# Patient Record
Sex: Female | Born: 1985
Health system: Southern US, Community
[De-identification: ages and names within clinical notes are randomized; demographics above are authoritative.]

## PROBLEM LIST (undated history)

## (undated) DIAGNOSIS — R12 Heartburn: Secondary | ICD-10-CM

## (undated) DIAGNOSIS — B019 Varicella without complication: Secondary | ICD-10-CM

## (undated) DIAGNOSIS — R17 Unspecified jaundice: Secondary | ICD-10-CM

## (undated) HISTORY — PX: WISDOM TOOTH EXTRACTION: SHX21

## (undated) HISTORY — DX: Heartburn: R12

## (undated) HISTORY — DX: Varicella without complication: B01.9

## (undated) HISTORY — DX: Unspecified jaundice: R17

---

## 2014-08-29 ENCOUNTER — Ambulatory Visit (INDEPENDENT_AMBULATORY_CARE_PROVIDER_SITE_OTHER): Payer: 59 | Admitting: Obstetrics & Gynecology

## 2014-08-29 ENCOUNTER — Encounter: Payer: Self-pay | Admitting: Obstetrics & Gynecology

## 2014-08-29 VITALS — BP 109/67 | HR 73 | Resp 16 | Ht 62.0 in | Wt 145.0 lb

## 2014-08-29 DIAGNOSIS — Z113 Encounter for screening for infections with a predominantly sexual mode of transmission: Secondary | ICD-10-CM

## 2014-08-29 DIAGNOSIS — Z01419 Encounter for gynecological examination (general) (routine) without abnormal findings: Secondary | ICD-10-CM | POA: Diagnosis not present

## 2014-08-29 DIAGNOSIS — Z124 Encounter for screening for malignant neoplasm of cervix: Secondary | ICD-10-CM

## 2014-08-29 DIAGNOSIS — Z1151 Encounter for screening for human papillomavirus (HPV): Secondary | ICD-10-CM | POA: Diagnosis not present

## 2014-08-29 DIAGNOSIS — Z0000 Encounter for general adult medical examination without abnormal findings: Secondary | ICD-10-CM

## 2014-08-29 NOTE — Progress Notes (Signed)
Subjective:    Brandi Estrada is a 29 y.o. MW G23[employer] who presents for an annual exam. The patient has no complaints today. The patient is sexually active. GYN screening history: last pap: was normal. The patient wears seatbelts: yes. The patient participates in regular exercise: yes. Has the patient ever been transfused or tattooed?: yes. The patient reports that there is not domestic violence in her life.   Menstrual History: OB History    Gravida Para Term Preterm AB TAB SAB Ectopic Multiple Living   '0 0 0 0 0 0 0 0 0 0 '      Menarche age: [age] No LMP recorded. Patient is not currently having periods (Reason: IUD).    The following portions of the patient's history were reviewed and updated as appropriate: allergies, current medications, past family history, past medical history, past social history, past surgical history and problem list.  Review of Systems Pertinent items are noted in HPI. 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 In a relative   Objective:    BP 109/67 mmHg  Pulse 73  Resp 16  Ht '5\' 2"'  (1.575 m)  Wt 145 lb (65.772 kg)  BMI 26.51 kg/m2  General Appearance:    Alert, cooperative, no distress, appears stated age  Head:    Normocephalic, without obvious abnormality, atraumatic  Eyes:    PERRL, conjunctiva/corneas clear, EOM's intact, fundi    benign, both eyes  Ears:    Normal TM's and external ear canals, both ears  Nose:   Nares normal, septum midline, mucosa normal, no drainage    or sinus tenderness  Throat:   Lips, mucosa, and tongue normal; teeth and gums normal  Neck:   Supple, symmetrical, trachea midline, no adenopathy;    thyroid:  no enlargement/tenderness/nodules; no carotid   bruit or JVD  Back:     Symmetric, no curvature, ROM normal, no CVA tenderness  Lungs:      Clear to auscultation bilaterally, respirations unlabored  Chest Wall:    No tenderness or deformity   Heart:    Regular rate and rhythm, S1 and S2 normal, no murmur, rub   or gallop  Breast Exam:    No tenderness, masses, or nipple abnormality  Abdomen:     Soft, non-tender, bowel sounds active all four quadrants,    no masses, no organomegaly  Genitalia:    Normal female without lesion, discharge or tenderness, shaved, NSSA, NT, mobile, normal adnexal exam     Extremities:   Extremities normal, atraumatic, no cyanosis or edema  Pulses:   2+ and symmetric all extremities  Skin:   Skin color, texture, turgor normal, no rashes or lesions  Lymph nodes:   Cervical, supraclavicular, and axillary nodes normal  Neurologic:   CNII-XII intact, normal strength, sensation and reflexes    throughout  .    Assessment:    Healthy female exam.   FH of breast/colon/ovary cancer   Plan:     Breast self exam technique reviewed and patient encouraged to perform self-exam monthly. Thin prep Pap smear.   Rec BRCA testing

## 2014-08-30 ENCOUNTER — Other Ambulatory Visit: Payer: 59

## 2014-08-30 LAB — CBC
HCT: 43.6 % (ref 36.0–46.0)
Hemoglobin: 14.8 g/dL (ref 12.0–15.0)
MCH: 29.3 pg (ref 26.0–34.0)
MCHC: 33.9 g/dL (ref 30.0–36.0)
MCV: 86.3 fL (ref 78.0–100.0)
MPV: 8.7 fL (ref 8.6–12.4)
Platelets: 251 10*3/uL (ref 150–400)
RBC: 5.05 MIL/uL (ref 3.87–5.11)
RDW: 13.2 % (ref 11.5–15.5)
WBC: 5.6 10*3/uL (ref 4.0–10.5)

## 2014-08-31 LAB — COMPREHENSIVE METABOLIC PANEL
ALBUMIN: 4.3 g/dL (ref 3.5–5.2)
ALK PHOS: 46 U/L (ref 39–117)
ALT: 11 U/L (ref 0–35)
AST: 14 U/L (ref 0–37)
BILIRUBIN TOTAL: 0.6 mg/dL (ref 0.2–1.2)
BUN: 14 mg/dL (ref 6–23)
CO2: 25 meq/L (ref 19–32)
Calcium: 9 mg/dL (ref 8.4–10.5)
Chloride: 104 mEq/L (ref 96–112)
Creat: 0.61 mg/dL (ref 0.50–1.10)
Glucose, Bld: 84 mg/dL (ref 70–99)
POTASSIUM: 4.2 meq/L (ref 3.5–5.3)
Sodium: 139 mEq/L (ref 135–145)
Total Protein: 7.1 g/dL (ref 6.0–8.3)

## 2014-08-31 LAB — VITAMIN D 25 HYDROXY (VIT D DEFICIENCY, FRACTURES): Vit D, 25-Hydroxy: 30 ng/mL (ref 30–100)

## 2014-08-31 LAB — LIPID PANEL
CHOL/HDL RATIO: 2.9 ratio
Cholesterol: 142 mg/dL (ref 0–200)
HDL: 49 mg/dL (ref >=46)
LDL Cholesterol: 83 mg/dL (ref 0–99)
Triglycerides: 50 mg/dL (ref <150)
VLDL: 10 mg/dL (ref 0–40)

## 2014-08-31 LAB — TSH: TSH: 1.95 u[IU]/mL (ref 0.350–4.500)

## 2014-09-02 ENCOUNTER — Telehealth: Payer: Self-pay | Admitting: *Deleted

## 2014-09-02 LAB — CYTOLOGY - PAP

## 2014-09-02 NOTE — Telephone Encounter (Signed)
LM on voicemail of normal labwork and will mail a copy to pt's home address.

## 2014-09-10 ENCOUNTER — Telehealth: Payer: Self-pay | Admitting: *Deleted

## 2014-09-10 DIAGNOSIS — R7989 Other specified abnormal findings of blood chemistry: Secondary | ICD-10-CM

## 2014-09-10 MED ORDER — VITAMIN D (ERGOCALCIFEROL) 1.25 MG (50000 UNIT) PO CAPS
50000.0000 [IU] | ORAL_CAPSULE | ORAL | Status: DC
Start: 2014-09-10 — End: 2016-06-30

## 2014-09-10 NOTE — Telephone Encounter (Signed)
-----   Message from Allie BossierMyra C Dove, MD sent at 09/09/2014  1:39 PM EDT ----- She will need to take prescription Vitamin D for 8 weeks. Thanks.

## 2014-09-10 NOTE — Telephone Encounter (Signed)
LMOM for pt to adv Vitamin D sent to pharm per Dr Marice Potterove.

## 2014-10-14 ENCOUNTER — Other Ambulatory Visit: Payer: 59

## 2014-11-21 ENCOUNTER — Other Ambulatory Visit: Payer: 59

## 2014-12-05 ENCOUNTER — Encounter: Payer: Self-pay | Admitting: *Deleted

## 2014-12-05 ENCOUNTER — Telehealth: Payer: Self-pay | Admitting: *Deleted

## 2014-12-05 NOTE — Telephone Encounter (Signed)
Pt notified of neg BRAC 1 and 2 and her copy given to her

## 2015-01-01 ENCOUNTER — Emergency Department
Admission: EM | Admit: 2015-01-01 | Discharge: 2015-01-01 | Disposition: A | Discharge status: Home / Self Care | Payer: 59 | Source: Home / Self Care | Attending: Family Medicine | Admitting: Family Medicine

## 2015-01-01 DIAGNOSIS — J069 Acute upper respiratory infection, unspecified: Secondary | ICD-10-CM

## 2015-01-01 LAB — POCT RAPID STREP A (OFFICE): Rapid Strep A Screen: NEGATIVE

## 2015-01-01 NOTE — ED Provider Notes (Signed)
CSN: [id number]     Arrival date & time 01/01/15  1105 History   First MD Initiated Contact with Patient 01/01/15 1118     Chief Complaint  Patient presents with  . Otalgia  . Sore Throat   (Consider location/radiation/quality/duration/timing/severity/associated sxs/prior Treatment) HPI  Patient is a 29 year old female presenting to urgent care with complaint of gradually worsening sore throat, right ear pain, body aches, subjective fever with chills.  She is also complaining of diffuse headache and sinus pressure with yellow discharge.  Pain is 6/10 at worst.  She has been taking ibuprofen with minimal relief.  Denies any nausea or vomiting.  Patient does work as a Lawyer at [employer]. No recent travel. No rashes or known tick bites.  History reviewed. No pertinent past medical history. Past Surgical History  Procedure Laterality Date  . Wisdom tooth extraction     Family History  Problem Relation Age of Onset  . Diabetes Father   . Diabetes Paternal Grandfather   . Cancer Maternal Grandmother     colon  . Heart disease Maternal Grandfather     heart surgery bypass  . Heart disease Maternal Uncle     heart trannsplant  . Cancer Mother     breast - Premenopause  . Cancer - Cervical Maternal Aunt   . Cancer Maternal Aunt     bone  . Hemachromatosis Father   . Hyperlipidemia Father   . Stroke Paternal Grandmother     lived to 96   History  Substance Use Topics  . Smoking status: Never Smoker   . Smokeless tobacco: Never Used  . Alcohol Use: No   OB History    Gravida Para Term Preterm AB TAB SAB Ectopic Multiple Living       Review of Systems  Constitutional: Positive for fever ( subjective), chills and fatigue. Negative for appetite change.  HENT: Positive for congestion, ear pain (Right), rhinorrhea, sinus pressure and sore throat. Negative for trouble swallowing and voice change.   Respiratory: Negative for cough and shortness of breath.    Cardiovascular: Negative for chest pain and palpitations.  Gastrointestinal: Negative for nausea, vomiting, abdominal pain and diarrhea.  Musculoskeletal: Negative for myalgias, back pain and arthralgias.  Skin: Negative for rash.  Neurological: Positive for headaches. Negative for dizziness and light-headedness.  All other systems reviewed and are negative.   Allergies  Review of patient's allergies indicates no known allergies.  Home Medications   Prior to Admission medications   Medication Sig Start Date End Date Taking? Authorizing Provider  cetirizine (ZYRTEC) 10 MG tablet Take 10 mg by mouth daily.   Yes Historical Provider, MD  Levonorgestrel (SKYLA IU) by Intrauterine route.    Historical Provider, MD  Multiple Vitamin (MULTIVITAMIN) tablet Take 1 tablet by mouth daily.    Historical Provider, MD  Vitamin D, Ergocalciferol, (DRISDOL) 50000 UNITS CAPS capsule Take 1 capsule (50,000 Units total) by mouth every 7 (seven) days. Take 1 capsule by mouth once a week for 8 weeks 09/10/14   Kendrick Ranch, MD   BP 100/67 mmHg  Pulse 95  Temp(Src) 99.5 F (37.5 C) (Oral)  Ht  (1.549 m)  Wt 135 lb 8 oz (61.462 kg)  BMI 25.62 kg/m2  SpO2 99%  LMP 12/18/2014 Physical Exam  Constitutional: She appears well-developed and well-nourished. No distress.  HENT:  Head: Normocephalic and atraumatic.  Right Ear: Hearing, external ear and ear canal normal.  No swelling or tenderness. No mastoid tenderness. Tympanic membrane is erythematous. Tympanic membrane is not retracted and not bulging. No middle ear effusion.  Left Ear: Hearing, tympanic membrane, external ear and ear canal normal.  Nose: Mucosal edema present. Right sinus exhibits no maxillary sinus tenderness and no frontal sinus tenderness. Left sinus exhibits no maxillary sinus tenderness and no frontal sinus tenderness.  Mouth/Throat: Uvula is midline and mucous membranes are normal. Posterior oropharyngeal erythema present.  No oropharyngeal exudate, posterior oropharyngeal edema or tonsillar abscesses.  Eyes: Conjunctivae are normal. No scleral icterus.  Neck: Normal range of motion. Neck supple.  Cardiovascular: Normal rate, regular rhythm and normal heart sounds.   Pulmonary/Chest: Effort normal and breath sounds normal. No respiratory distress. She has no wheezes. She has no rales. She exhibits no tenderness.  Abdominal: Soft. Bowel sounds are normal. She exhibits no distension and no mass. There is no tenderness. There is no rebound and no guarding.  Musculoskeletal: Normal range of motion.  Lymphadenopathy:    She has no cervical adenopathy.  Neurological: She is alert.  Skin: Skin is warm and dry. She is not diaphoretic.  Nursing note and vitals reviewed.   ED Course  Procedures (including critical care time) Labs Review Labs Reviewed  POCT RAPID STREP A (OFFICE)    Imaging Review No results found.   MDM   1. Acute upper respiratory infection    Patient is a 29 year old female complaining of URI symptoms for last 2 days. Vitals unremarkable. Rapid strep negative.  Culture sent. No indication to start antibiotics at this time his symptoms likely viral in nature. Encourage fluids rest.  May take acetaminophen and ibuprofen as needed for fever and pain.  Follow-up with PCP in 3-4 days and recheck for symptoms if not improving. Return precautions provided. Pt verbalized understanding and agreement with tx plan.    Junius Finner, PA-C 01/01/15 1142

## 2015-01-01 NOTE — Discharge Instructions (Signed)
You may take 400-600mg Ibuprofen (Motrin) every 6-8 hours for fever and pain  °Alternate with Tylenol  °You may take 500mg Tylenol every 4-6 hours as needed for fever and pain  °Follow-up with your primary care provider next week for recheck of symptoms if not improving.  °Be sure to drink plenty of fluids and rest, at least 8hrs of sleep a night, preferably more while you are sick. °Return urgent care or go to closest ER if you cannot keep down fluids/signs of dehydration, fever not reducing with Tylenol, difficulty breathing/wheezing, stiff neck, worsening condition, or other concerns (see below)  ° °Cool Mist Vaporizers °Vaporizers may help relieve the symptoms of a cough and cold. They add moisture to the air, which helps mucus to become thinner and less sticky. This makes it easier to breathe and cough up secretions. Cool mist vaporizers do not cause serious burns like hot mist vaporizers, which may also be called steamers or humidifiers. Vaporizers have not been proven to help with colds. You should not use a vaporizer if you are allergic to mold. °HOME CARE INSTRUCTIONS °· Follow the package instructions for the vaporizer. °· Do not use anything other than distilled water in the vaporizer. °· Do not run the vaporizer all of the time. This can cause mold or bacteria to grow in the vaporizer. °· Clean the vaporizer after each time it is used. °· Clean and dry the vaporizer well before storing it. °· Stop using the vaporizer if worsening respiratory symptoms develop. °Document Released: 02/12/2004 Document Revised: 05/22/2013 Document Reviewed: 10/04/2012 °ExitCare® Patient Information ©2015 [employer]. This information is not intended to replace advice given to you by your health care provider. Make sure you discuss any questions you have with your health care provider. ° °Upper Respiratory Infection, Adult °An upper respiratory infection (URI) is also sometimes known as the common cold. The upper  respiratory tract includes the nose, sinuses, throat, trachea, and bronchi. Bronchi are the airways leading to the lungs. Most people improve within 1 week, but symptoms can last up to 2 weeks. A residual cough may last even longer.  °CAUSES °Many different viruses can infect the tissues lining the upper respiratory tract. The tissues become irritated and inflamed and often become very moist. Mucus production is also common. A cold is contagious. You can easily spread the virus to others by oral contact. This includes kissing, sharing a glass, coughing, or sneezing. Touching your mouth or nose and then touching a surface, which is then touched by another person, can also spread the virus. °SYMPTOMS  °Symptoms typically develop 1 to 3 days after you come in contact with a cold virus. Symptoms vary from person to person. They may include: °· Runny nose. °· Sneezing. °· Nasal congestion. °· Sinus irritation. °· Sore throat. °· Loss of voice (laryngitis). °· Cough. °· Fatigue. °· Muscle aches. °· Loss of appetite. °· Headache. °· Low-grade fever. °DIAGNOSIS  °You might diagnose your own cold based on familiar symptoms, since most people get a cold 2 to 3 times a year. Your caregiver can confirm this based on your exam. Most importantly, your caregiver can check that your symptoms are not due to another disease such as strep throat, sinusitis, pneumonia, asthma, or epiglottitis. Blood tests, throat tests, and X-rays are not necessary to diagnose a common cold, but they may sometimes be helpful in excluding other more serious diseases. Your caregiver will decide if any further tests are required. °RISKS AND COMPLICATIONS  °You may   be at risk for a more severe case of the common cold if you smoke cigarettes, have chronic heart disease (such as heart failure) or lung disease (such as asthma), or if you have a weakened immune system. The very young and very old are also at risk for more serious infections. Bacterial  sinusitis, middle ear infections, and bacterial pneumonia can complicate the common cold. The common cold can worsen asthma and chronic obstructive pulmonary disease (COPD). Sometimes, these complications can require emergency medical care and may be life-threatening. °PREVENTION  °The best way to protect against getting a cold is to practice good hygiene. Avoid oral or hand contact with people with cold symptoms. Wash your hands often if contact occurs. There is no clear evidence that vitamin C, vitamin E, echinacea, or exercise reduces the chance of developing a cold. However, it is always recommended to get plenty of rest and practice good nutrition. °TREATMENT  °Treatment is directed at relieving symptoms. There is no cure. Antibiotics are not effective, because the infection is caused by a virus, not by bacteria. Treatment may include: °· Increased fluid intake. Sports drinks offer valuable electrolytes, sugars, and fluids. °· Breathing heated mist or steam (vaporizer or shower). °· Eating chicken soup or other clear broths, and maintaining good nutrition. °· Getting plenty of rest. °· Using gargles or lozenges for comfort. °· Controlling fevers with ibuprofen or acetaminophen as directed by your caregiver. °· Increasing usage of your inhaler if you have asthma. °Zinc gel and zinc lozenges, taken in the first 24 hours of the common cold, can shorten the duration and lessen the severity of symptoms. Pain medicines may help with fever, muscle aches, and throat pain. A variety of non-prescription medicines are available to treat congestion and runny nose. Your caregiver can make recommendations and may suggest nasal or lung inhalers for other symptoms.  °HOME CARE INSTRUCTIONS  °· Only take over-the-counter or prescription medicines for pain, discomfort, or fever as directed by your caregiver. °· Use a warm mist humidifier or inhale steam from a shower to increase air moisture. This may keep secretions moist and  make it easier to breathe. °· Drink enough water and fluids to keep your urine clear or pale yellow. °· Rest as needed. °· Return to work when your temperature has returned to normal or as your caregiver advises. You may need to stay home longer to avoid infecting others. You can also use a face mask and careful hand washing to prevent spread of the virus. °SEEK MEDICAL CARE IF:  °· After the first few days, you feel you are getting worse rather than better. °· You need your caregiver's advice about medicines to control symptoms. °· You develop chills, worsening shortness of breath, or brown or red sputum. These may be signs of pneumonia. °· You develop yellow or brown nasal discharge or pain in the face, especially when you bend forward. These may be signs of sinusitis. °· You develop a fever, swollen neck glands, pain with swallowing, or white areas in the back of your throat. These may be signs of strep throat. °SEEK IMMEDIATE MEDICAL CARE IF:  °· You have a fever. °· You develop severe or persistent headache, ear pain, sinus pain, or chest pain. °· You develop wheezing, a prolonged cough, cough up blood, or have a change in your usual mucus (if you have chronic lung disease). °· You develop sore muscles or a stiff neck. °Document Released: 11/10/2000 Document Revised: 08/09/2011 Document Reviewed: 08/22/2013 °ExitCare® Patient   Information ©2015 [employer]. This information is not intended to replace advice given to you by your health care provider. Make sure you discuss any questions you have with your health care provider. ° °

## 2015-01-01 NOTE — ED Notes (Signed)
Symptoms started Monday with a sore throat mostly on the right,  Sore when swallowing, Monday and Tuesday nights, Achy,Chills and night,left ear pain, migraine, sinus pressure with yellow drainage

## 2015-01-02 LAB — STREP A DNA PROBE: GASP: NEGATIVE

## 2015-01-04 ENCOUNTER — Telehealth: Payer: Self-pay | Admitting: Emergency Medicine

## 2015-05-15 ENCOUNTER — Telehealth: Payer: Self-pay | Admitting: *Deleted

## 2015-05-15 NOTE — Telephone Encounter (Signed)
Pt called in wanting beta hcg due to having nausea and not wanting to eat. Pt took UPT - negative. Currently has IUD. Explained to pt that she would need to come in to see a provider in order for lab to be drawn. Pt works 3rd shift and no appt available when she was available. She explained she would wait a while longer to see if Sx subside. If not she will call back in to make appt.

## 2015-09-10 ENCOUNTER — Encounter: Payer: Self-pay | Admitting: Obstetrics & Gynecology

## 2015-09-10 ENCOUNTER — Ambulatory Visit: Payer: 59 | Admitting: Obstetrics & Gynecology

## 2015-09-10 VITALS — BP 133/82 | HR 78 | Resp 16 | Wt 142.0 lb

## 2015-09-10 DIAGNOSIS — Z30014 Encounter for initial prescription of intrauterine contraceptive device: Secondary | ICD-10-CM

## 2015-09-10 MED ORDER — MISOPROSTOL 200 MCG PO TABS: ORAL_TABLET | ORAL | Status: DC

## 2015-09-10 MED FILL — miSOPROStol 200 MCG TABS: 200 | 1 days supply | Qty: 3 | Fill #0

## 2015-09-10 NOTE — Progress Notes (Signed)
   Subjective:    Patient ID: Brandi Estrada, female    DOB: 12-Dec-1985, 30 y.o.   MRN: [id number]  [facility] as her has recently expired.   Review of Systems     Objective:   Physical Exam        Assessment & Plan:  We don't have a Skyla in stock. She [facility] take cytotec tonight [facility]

## 2015-09-11 ENCOUNTER — Ambulatory Visit (INDEPENDENT_AMBULATORY_CARE_PROVIDER_SITE_OTHER): Payer: 59 | Admitting: Obstetrics & Gynecology

## 2015-09-11 ENCOUNTER — Encounter: Payer: Self-pay | Admitting: Obstetrics & Gynecology

## 2015-09-11 VITALS — BP 134/78 | HR 78 | Resp 16 | Ht 63.0 in | Wt 142.0 lb

## 2015-09-11 DIAGNOSIS — Z30433 Encounter for removal and reinsertion of intrauterine contraceptive device: Secondary | ICD-10-CM

## 2015-09-11 DIAGNOSIS — Z30430 Encounter for insertion of intrauterine contraceptive device: Secondary | ICD-10-CM

## 2015-09-11 DIAGNOSIS — Z01812 Encounter for preprocedural laboratory examination: Secondary | ICD-10-CM | POA: Diagnosis not present

## 2015-09-11 LAB — POCT URINE PREGNANCY: Preg Test, Ur: NEGATIVE

## 2015-09-11 MED ORDER — LEVONORGESTREL 13.5 MG IU IUD
1.0000 | INTRAUTERINE_SYSTEM | Freq: Once | INTRAUTERINE | Status: AC
  Administered 2015-09-11: 1 via INTRAUTERINE

## 2015-09-11 NOTE — Progress Notes (Signed)
   Subjective:    Patient ID: Brandi Estrada, female    DOB: Nov 13, 1985, 30 y.o.   MRN: [id number]  HPI  30 yo W woman here for replacement of her newly expired Skyla. She took cytotec last night.  Review of Systems     Objective:   Physical Exam  Her Christean GriefSkyla was easily removed and noted to be intact UPT negative, consent signed, Time out procedure done. Cervix prepped with betadine and grasped with a single tooth tenaculum. Christean GriefSkyla was easily placed and the strings were cut to 3-4 cm. Uterus sounded to 8 cm. She tolerated the procedure well.         Assessment & Plan:  Contraception- Christean GriefSkyla

## 2015-10-08 ENCOUNTER — Ambulatory Visit (INDEPENDENT_AMBULATORY_CARE_PROVIDER_SITE_OTHER): Payer: 59 | Admitting: Family Medicine

## 2015-10-08 VITALS — BP 102/76 | HR 83 | Temp 97.6°F | Resp 16 | Ht 62.0 in | Wt 149.0 lb

## 2015-10-08 DIAGNOSIS — J069 Acute upper respiratory infection, unspecified: Secondary | ICD-10-CM | POA: Diagnosis not present

## 2015-10-08 MED ORDER — ALBUTEROL SULFATE HFA 108 (90 BASE) MCG/ACT IN AERS: 2.0000 | INHALATION_SPRAY | Freq: Four times a day (QID) | RESPIRATORY_TRACT | Status: DC | PRN

## 2015-10-08 MED ORDER — HYDROCOD POLST-CPM POLST ER 10-8 MG/5ML PO SUER: 5.0000 mL | Freq: Two times a day (BID) | ORAL | Status: DC | PRN

## 2015-10-08 MED ORDER — AZELASTINE HCL 0.1 % NA SOLN: 2.0000 | Freq: Two times a day (BID) | NASAL | Status: DC

## 2015-10-08 MED ORDER — PSEUDOEPHEDRINE HCL ER 120 MG PO TB12: 120.0000 mg | ORAL_TABLET | Freq: Two times a day (BID) | ORAL | Status: DC

## 2015-10-08 MED FILL — SM NASAL DECONGEST ER 120 M: 120 | 10 days supply | Qty: 20 | Fill #0

## 2015-10-08 MED FILL — AZELASTINE HCL 137 MCG SPRY: 0.1 | 30 days supply | Qty: 30 | Fill #0

## 2015-10-08 MED FILL — HYDROCODONE-CHLORPHENIRAM S: 10-8 | 18 days supply | Qty: 180 | Fill #0

## 2015-10-08 MED FILL — VENTOLIN HFA 90 MCG INHALER: 108 (90 BAS | 25 days supply | Qty: 18 | Fill #0

## 2015-10-08 NOTE — Patient Instructions (Addendum)
1.  Take pseudoephedrine twice daily for 5 days. 2. Take Astelin nasal spray twice daily 3.  Take Tussionex twice daily for cough 4.  Use Albuterol inhaler 4 times daily for one week and then as needed    IF you received an x-ray today, you will receive an invoice from [facility]. Please contact [facility] at [phone] with questions or concerns regarding your invoice.   IF you received labwork today, you will receive an invoice from [facility]. Please contact [facility] at [phone] with questions or concerns regarding your invoice.   Our billing staff will not be able to assist you with questions regarding bills from these companies.  You will be contacted with the lab results as soon as they are available. The fastest way to get your results is to activate your My Chart account. Instructions are located on the last page of this paperwork. If you have not heard from us regarding the results in 2 weeks, please contact this office.     Upper Respiratory Infection, Adult Most upper respiratory infections (URIs) are a viral infection of the air passages leading to the lungs. A URI affects the nose, throat, and upper air passages. The most common type of URI is nasopharyngitis and is typically referred to as "the common cold." URIs run their course and usually go away on their own. Most of the time, a URI does not require medical attention, but sometimes a bacterial infection in the upper airways can follow a viral infection. This is called a secondary infection. Sinus and middle ear infections are common types of secondary upper respiratory infections. Bacterial pneumonia can also complicate a URI. A URI can worsen asthma and chronic obstructive pulmonary disease (COPD). Sometimes, these complications can require emergency medical care and may be life threatening.  CAUSES Almost all URIs are caused by viruses. A virus is a type of germ and can  spread from one person to another.  RISKS FACTORS You may be at risk for a URI if:   You smoke.   You have chronic heart or lung disease.  You have a weakened defense (immune) system.   You are very young or very old.   You have nasal allergies or asthma.  You work in crowded or poorly ventilated areas.  You work in health care facilities or schools. SIGNS AND SYMPTOMS  Symptoms typically develop 2-3 days after you come in contact with a cold virus. Most viral URIs last 7-10 days. However, viral URIs from the influenza virus (flu virus) can last 14-18 days and are typically more severe. Symptoms may include:   Runny or stuffy (congested) nose.   Sneezing.   Cough.   Sore throat.   Headache.   Fatigue.   Fever.   Loss of appetite.   Pain in your forehead, behind your eyes, and over your cheekbones (sinus pain).  Muscle aches.  DIAGNOSIS  Your health care provider may diagnose a URI by:  Physical exam.  Tests to check that your symptoms are not due to another condition such as:  Strep throat.  Sinusitis.  Pneumonia.  Asthma. TREATMENT  A URI goes away on its own with time. It cannot be cured with medicines, but medicines may be prescribed or recommended to relieve symptoms. Medicines may help:  Reduce your fever.  Reduce your cough.  Relieve nasal congestion. HOME CARE INSTRUCTIONS   Take medicines only as directed by your health care provider.   Gargle warm saltwater or  take cough drops to comfort your throat as directed by your health care provider.  Use a warm mist humidifier or inhale steam from a shower to increase air moisture. This may make it easier to breathe.  Drink enough fluid to keep your urine clear or pale yellow.   Eat soups and other clear broths and maintain good nutrition.   Rest as needed.   Return to work when your temperature has returned to normal or as your health care provider advises. You may need to  stay home longer to avoid infecting others. You can also use a face mask and careful hand washing to prevent spread of the virus.  Increase the usage of your inhaler if you have asthma.   Do not use any tobacco products, including cigarettes, chewing tobacco, or electronic cigarettes. If you need help quitting, ask your health care provider. PREVENTION  The best way to protect yourself from getting a cold is to practice good hygiene.   Avoid oral or hand contact with people with cold symptoms.   Wash your hands often if contact occurs.  There is no clear evidence that vitamin C, vitamin E, echinacea, or exercise reduces the chance of developing a cold. However, it is always recommended to get plenty of rest, exercise, and practice good nutrition.  SEEK MEDICAL CARE IF:   You are getting worse rather than better.   Your symptoms are not controlled by medicine.   You have chills.  You have worsening shortness of breath.  You have brown or red mucus.  You have yellow or brown nasal discharge.  You have pain in your face, especially when you bend forward.  You have a fever.  You have swollen neck glands.  You have pain while swallowing.  You have white areas in the back of your throat. SEEK IMMEDIATE MEDICAL CARE IF:   You have severe or persistent:  Headache.  Ear pain.  Sinus pain.  Chest pain.  You have chronic lung disease and any of the following:  Wheezing.  Prolonged cough.  Coughing up blood.  A change in your usual mucus.  You have a stiff neck.  You have changes in your:  Vision.  Hearing.  Thinking.  Mood. MAKE SURE YOU:   Understand these instructions.  Will watch your condition.  Will get help right away if you are not doing well or get worse.   This information is not intended to replace advice given to you by your health care provider. Make sure you discuss any questions you have with your health care provider.   Document  Released: 11/10/2000 Document Revised: 10/01/2014 Document Reviewed: 08/22/2013 Elsevier Interactive Patient Education [employer]

## 2015-10-08 NOTE — Progress Notes (Signed)
Subjective:    Patient ID: Brandi Estrada, female    DOB: [birth year], 30 y.o.   MRN: [id number]  10/08/2015  Cough and Nasal Congestion   HPI This 30 y.o. female presents for acute visit for cough, nasal congestion.  Onset four days ago.  [place].  Works third shift.  No fever but +chills.  No sweats.  +HA. +teeth hurting.  +ears full.  +horrible nasal congestion.  Makes sterile ivs in pharmacy.  Must wear face mask; shoved tissues in nose most of the night.  +rhinorrhea; clear.  +PND; yellowish green mix.  No allergic rhinitis nasal congestion. +scratchy throat five days ago.  Initially dry cough; boyfriend is an [employer] fund[employer].  No wheezing.  Small amount of sputum.  Scant sputum. Taking Mucinex, sinus OTC medication phenylephrine.  Cannot control cough; cannot sleep.  Sletp two hours yesterday.  Gets choked and run to restroom.  No diarrhea; vomited x 2 post-tussive.  No DOE/SOB.  No tobacco.  No asthma; childhood asthma.  +sneezing horribly.  No sick contacts.  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  Works in [employer] environment.  Taking Zyrtec.Deviated septum.  S/p ENT consultation; suggested surgical correction.   Review of Systems  Constitutional: Negative for fever, chills, diaphoresis and fatigue.  HENT: Negative for ear pain, postnasal drip, rhinorrhea, sinus pressure, sore throat and trouble swallowing.   Respiratory: Negative for cough and shortness of breath.   Cardiovascular: Negative for chest pain, palpitations and leg swelling.  Gastrointestinal: Negative for nausea, vomiting, abdominal pain, diarrhea and constipation.    History reviewed. No pertinent past medical history. Past Surgical History  Procedure Laterality Date  . Wisdom tooth extraction     No Known Allergies  Social History   Social History  . Marital Status: Married    Spouse Name: N/A  . Number of Children: N/A  . Years of Education: N/A   Occupational History  . pharm tech    Social  History Main Topics  . Smoking status: Never Smoker   . Smokeless tobacco: Never Used  . Alcohol Use: No  . Drug Use: No  . Sexual Activity:    Partners: Male    Birth Control/ Protection: IUD   Other Topics Concern  . Not on file   Social History Narrative   Family History  Problem Relation Age of Onset  . Diabetes Father   . Diabetes Paternal Grandfather   . Cancer Maternal Grandmother     colon  . Heart disease Maternal Grandfather     heart surgery bypass  . Heart disease Maternal Uncle     heart trannsplant  . Cancer Mother     breast - Premenopause  . Cancer - Cervical Maternal Aunt   . Cancer Maternal Aunt     bone  . Hemachromatosis Father   . Hyperlipidemia Father   . Stroke Paternal Grandmother     lived to 111       Objective:    BP 102/76 mmHg  Pulse 83  Temp(Src) 97.6 F (36.4 C)  Resp 16  Ht 5\' 2"  (1.575 m)  Wt 149 lb (67.586 kg)  BMI 27.25 kg/m2  SpO2 99% Physical Exam  Constitutional: She is oriented to person, place, and time. She appears well-developed and well-nourished. No distress.  HENT:  Head: Normocephalic and atraumatic.  Eyes: Conjunctivae are normal. Pupils are equal, round, and reactive to light.  Neck: Normal range of motion. Neck supple.  Cardiovascular: Normal rate,  regular rhythm and normal heart sounds.  Exam reveals no gallop and no friction rub.   No murmur heard. Pulmonary/Chest: Effort normal and breath sounds normal. She has no wheezes. She has no rales.  Neurological: She is alert and oriented to person, place, and time.  Skin: She is not diaphoretic.  Psychiatric: She has a normal mood and affect. Her behavior is normal.  Nursing note and vitals reviewed.  Results for orders placed or performed in visit on 09/11/15  POCT urine pregnancy  Result Value Ref Range   Preg Test, Ur Negative Negative       Assessment & Plan:   1. Acute upper respiratory infection    -New. -Rx provided; supportive care with rest,  fluids, Ibuprofen and/or Tylenol. -RTC for acute worsening.   No orders of the defined types were placed in this encounter.   Meds ordered this encounter  Medications  . pseudoephedrine (SUDAFED) 120 MG 12 hr tablet    Sig: Take 1 tablet (120 mg total) by mouth 2 (two) times daily.    Dispense:  20 tablet    Refill:  0  . chlorpheniramine-HYDROcodone (TUSSIONEX PENNKINETIC ER) 10-8 MG/5ML SUER    Sig: Take 5 mLs by mouth every 12 (twelve) hours as needed for cough.    Dispense:  180 mL    Refill:  0    GENERIC EQUIVALENT  . albuterol (PROVENTIL HFA;VENTOLIN HFA) 108 (90 Base) MCG/ACT inhaler    Sig: Inhale 2 puffs into the lungs every 6 (six) hours as needed.    Dispense:  1 Inhaler    Refill:  1    DISPENSE ALBUTEROL HFA THAT INSURANCE COVERS  . azelastine (ASTELIN) 0.1 % nasal spray    Sig: Place 2 sprays into both nostrils 2 (two) times daily. Use in each nostril as directed    Dispense:  30 mL    Refill:  12    No Follow-up on file.    Kristi Paulita Fujita, M.D. [facility] [address] [place], [place]  [address] [phone] phone [phone] fax

## 2015-10-15 ENCOUNTER — Telehealth: Payer: Self-pay | Admitting: Family Medicine

## 2015-10-15 MED ORDER — AMOXICILLIN-POT CLAVULANATE 875-125 MG PO TABS: 1.0000 | ORAL_TABLET | Freq: Two times a day (BID) | ORAL | Status: DC

## 2015-10-15 MED ORDER — PREDNISONE 20 MG PO TABS: ORAL_TABLET | ORAL | Status: DC

## 2015-10-15 MED FILL — AMOX TR-K CLV 875-125 MG TA: 875-125 | 10 days supply | Qty: 20 | Fill #0

## 2015-10-15 MED FILL — predniSONE 20 MG TABS: 20 | 10 days supply | Qty: 15 | Fill #0

## 2015-10-15 NOTE — Telephone Encounter (Addendum)
Patient calling stating that she is not improved.  Has been taking Sudafed 120mg  bid, Tussionex, Albuterol, Astelin.  Patient continues to suffer with significant nasal congestion, sinus pressure, sore throat, some coughing. A/P: sinusitis: Augmentin, Prednisone sent to pharmacy.Continue all medications prescribed last week.  [facility].

## 2016-04-19 ENCOUNTER — Telehealth: Payer: 59 | Admitting: Family

## 2016-04-19 DIAGNOSIS — J069 Acute upper respiratory infection, unspecified: Secondary | ICD-10-CM | POA: Diagnosis not present

## 2016-04-19 DIAGNOSIS — B9789 Other viral agents as the cause of diseases classified elsewhere: Secondary | ICD-10-CM

## 2016-04-19 MED ORDER — BENZONATATE 100 MG PO CAPS: 100.0000 mg | ORAL_CAPSULE | Freq: Two times a day (BID) | ORAL | 0 refills | Status: DC | PRN

## 2016-04-19 MED ORDER — PREDNISONE 10 MG (21) PO TBPK: ORAL_TABLET | ORAL | 0 refills | Status: DC

## 2016-04-19 MED FILL — predniSONE 10 MG TABS: 10 | 6 days supply | Qty: 21 | Fill #0

## 2016-04-19 NOTE — Progress Notes (Signed)
We are sorry that you are not feeling well.  Here is how we plan to help!  Based on what you have shared with me it looks like you have upper respiratory tract inflammation that has resulted in a significant cough.  Inflammation and infection in the upper respiratory tract is commonly called bronchitis and has four common causes:  Allergies, Viral Infections, Acid Reflux and Bacterial Infections.  Allergies, viruses and acid reflux are treated by controlling symptoms or eliminating the cause. An example might be a cough caused by taking certain blood pressure medications. You stop the cough by changing the medication. Another example might be a cough caused by acid reflux. Controlling the reflux helps control the cough.  Based on your presentation I believe you most likely have A cough due to a virus.  This is called viral bronchitis and is best treated by rest, plenty of fluids and control of the cough.  You may use Ibuprofen or Tylenol as directed to help your symptoms.     In addition you may use A non-prescription cough medication called Robitussin DAC. Take 2 teaspoons every 8 hours or Delsym: take 2 teaspoons every 12 hours., A non-prescription cough medication called Mucinex DM: take 2 tablets every 12 hours. and A prescription cough medication called Tessalon Perles 100mg. You may take 1-2 capsules every 8 hours as needed for your cough. I also sent in a prescription for a steroid dose pack,Sterapred 10 mg dosepak.  USE OF BRONCHODILATOR ("RESCUE") INHALERS: There is a risk from using your bronchodilator too frequently.  The risk is that over-reliance on a medication which only relaxes the muscles surrounding the breathing tubes can reduce the effectiveness of medications prescribed to reduce swelling and congestion of the tubes themselves.  Although you feel brief relief from the bronchodilator inhaler, your asthma may actually be worsening with the tubes becoming more swollen and filled with  mucus.  This can delay other crucial treatments, such as oral steroid medications. If you need to use a bronchodilator inhaler daily, several times per day, you should discuss this with your provider.  There are probably better treatments that could be used to keep your asthma under control.     HOME CARE . Only take medications as instructed by your medical team. . Complete the entire course of an antibiotic. . Drink plenty of fluids and get plenty of rest. . Avoid close contacts especially the very young and the elderly . Cover your mouth if you cough or cough into your sleeve. . Always remember to wash your hands . A steam or ultrasonic humidifier can help congestion.   GET HELP RIGHT AWAY IF: . You develop worsening fever. . You become short of breath . You cough up blood. . Your symptoms persist after you have completed your treatment plan MAKE SURE YOU   Understand these instructions.  Will watch your condition.  Will get help right away if you are not doing well or get worse.  Your e-visit answers were reviewed by a board certified advanced clinical practitioner to complete your personal care plan.  Depending on the condition, your plan could have included both over the counter or prescription medications. If there is a problem please reply  once you have received a response from your provider. Your safety is important to us.  If you have drug allergies check your prescription carefully.    You can use MyChart to ask questions about today's visit, request a non-urgent call back, or ask   for a work or school excuse for 24 hours related to this e-Visit. If it has been greater than 24 hours you will need to follow up with your provider, or enter a new e-Visit to address those concerns. You will get an e-mail in the next two days asking about your experience.  I hope that your e-visit has been valuable and will speed your recovery. Thank you for using e-visits.     

## 2016-06-30 ENCOUNTER — Ambulatory Visit (INDEPENDENT_AMBULATORY_CARE_PROVIDER_SITE_OTHER): Payer: 59 | Admitting: Family Medicine

## 2016-06-30 ENCOUNTER — Encounter: Payer: Self-pay | Admitting: Family Medicine

## 2016-06-30 ENCOUNTER — Ambulatory Visit (INDEPENDENT_AMBULATORY_CARE_PROVIDER_SITE_OTHER)
Admission: RE | Admit: 2016-06-30 | Discharge: 2016-06-30 | Disposition: A | Discharge status: Home / Self Care | Payer: 59 | Source: Ambulatory Visit | Attending: Family Medicine | Admitting: Family Medicine

## 2016-06-30 VITALS — BP 120/86 | HR 78 | Temp 98.3°F | Ht 61.5 in | Wt 156.8 lb

## 2016-06-30 DIAGNOSIS — R21 Rash and other nonspecific skin eruption: Secondary | ICD-10-CM

## 2016-06-30 DIAGNOSIS — R05 Cough: Secondary | ICD-10-CM

## 2016-06-30 DIAGNOSIS — R053 Chronic cough: Secondary | ICD-10-CM

## 2016-06-30 MED ORDER — HYDROCOD POLST-CPM POLST ER 10-8 MG/5ML PO SUER: 5.0000 mL | Freq: Two times a day (BID) | ORAL | 0 refills | Status: AC | PRN

## 2016-06-30 MED ORDER — METRONIDAZOLE 0.75 % EX GEL: CUTANEOUS | 1 refills | Status: DC

## 2016-06-30 MED ORDER — FAMOTIDINE 20 MG PO TABS: 20.0000 mg | ORAL_TABLET | Freq: Two times a day (BID) | ORAL | 2 refills | Status: DC

## 2016-06-30 MED FILL — metroNIDAZOLE 0.75 % GEL: 0.75 | 30 days supply | Qty: 45 | Fill #0

## 2016-06-30 MED FILL — FAMOTIDINE 20 MG TABLET: 20 | 30 days supply | Qty: 60 | Fill #0

## 2016-06-30 NOTE — Patient Instructions (Addendum)
Will get records for Tdap- thanks for signing form  Please go to [facility] for chest x-ray - located [address] across [address] from [facility] - in the basement - Hours: 8:30-5:30 PM M-F. Do not need appointment.    Trial pepcid for at least 1 month twice a day  Refilled tussionex for sparing use  Trial metrogel for potential rosacea. There isnt great data on improvement with this. Happy to refer to dermatology if needed- you can also call yourself. [facility] usually does a pretty good job of getting patients in within reasonable time frame .

## 2016-06-30 NOTE — Progress Notes (Signed)
Pre visit review using our clinic review tool, if applicable. No additional management support is needed unless otherwise documented below in the visit note. 

## 2016-06-30 NOTE — Progress Notes (Signed)
Phone: [phone]  Subjective:  Patient presents today to establish care.  Prior patient of [facility]- last seen a few years ago and died 4-5 years ago. Sees OB Dr. Marice Potterove. Chief complaint-noted.   See problem oriented charting  The following were reviewed and entered/updated in epic: Past Medical History:  Diagnosis Date  . Chicken pox   . Heartburn   . Jaundice    as baby   There are no active problems to display for this patient.  Past Surgical History:  Procedure Laterality Date  . WISDOM TOOTH EXTRACTION      Family History  Problem Relation Age of Onset  . Cancer Mother     breast - Premenopause. age 29. DCIS  . Colonic polyp Mother   . Diabetes Father   . Hemachromatosis Father   . Hyperlipidemia Father   . Hypertension Father   . Diabetes Paternal Grandfather     committed suicide  . Cancer Maternal Grandmother     colon. mom gets early screenigns  . Heart disease Maternal Grandfather     heart surgery bypass  . Heart disease Maternal Uncle     heart trannsplant. died when stopped taking meds  . Cancer - Cervical Maternal Aunt   . Cancer Maternal Aunt     bone  . Stroke Paternal Grandmother     Medications- reviewed and updated Current Outpatient Prescriptions  Medication Sig Dispense Refill  . albuterol (PROVENTIL HFA;VENTOLIN HFA) 108 (90 Base) MCG/ACT inhaler Inhale 2 puffs into the lungs every 6 (six) hours as needed. 1 Inhaler 1  . Multiple Vitamin (MULTIVITAMIN) tablet Take 1 tablet by mouth daily.    . chlorpheniramine-HYDROcodone (TUSSIONEX PENNKINETIC ER) 10-8 MG/5ML SUER Take 5 mLs by mouth every 12 (twelve) hours as needed for cough. 180 mL 0  . famotidine (PEPCID) 20 MG tablet Take 1 tablet (20 mg total) by mouth 2 (two) times daily. 60 tablet 2  . metroNIDAZOLE (METROGEL) 0.75 % gel Apply and rub a thin film twice daily, morning and evening, to entire affected areas after washing 45 g 1   No current facility-administered  medications for this visit.     Allergies-reviewed and updated No Known Allergies  Social History   Social History  . Marital status: Married    Spouse name: N/A  . Number of children: N/A  . Years of education: N/A   Occupational History  . pharm tech    Social History Main Topics  . Smoking status: Never Smoker  . Smokeless tobacco: Never Used  . Alcohol use 0.0 - 0.6 oz/week  . Drug use: No  . Sexual activity: Yes    Partners: Male    Birth control/ protection: IUD   Other Topics Concern  . None   Social History Narrative   Single/divorced. Long term BF is ICU nurse. Has one roommate in addition to BF.       [facility] 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 Wants to do Peds- PICU or NICU RN   College- some at [employer]    ROS--Full ROS was completed Review of Systems  Constitutional: Negative for fever.  HENT: Negative for hearing loss and tinnitus.   Eyes: Negative for blurred vision and double vision.  Respiratory: Positive for cough, sputum production (intermittent) and wheezing (but seems to be upper airway pre patient report). Negative for hemoptysis and shortness of breath.   Cardiovascular: Negative for chest pain and  palpitations.  Gastrointestinal: Positive for heartburn. Negative for nausea and vomiting.  Genitourinary: Negative for dysuria and urgency.  Musculoskeletal: Negative for myalgias and neck pain.  Skin: Positive for rash. Negative for itching.  Neurological: Negative for dizziness and headaches.  Endo/Heme/Allergies: Negative for polydipsia. Does not bruise/bleed easily.  Psychiatric/Behavioral: Negative for hallucinations and substance abuse.   Objective: BP 120/86 (BP Location: Left Arm, Patient Position: Sitting, Cuff Size: Normal)   Pulse 78   Temp 98.3 F (36.8 C) (Oral)   Ht 5' 1.5" (1.562 m)   Wt 156 lb 12.8 oz (71.1 kg)   SpO2 98%   BMI 29.15 kg/m  Gen: NAD, resting comfortably,  intermittent cough HEENT: Mucous membranes are moist. Oropharynx normal. TM normal. Eyes: sclera and lids normal, PERRLA Neck: no thyromegaly, no cervical lymphadenopathy CV: RRR no murmurs rubs or gallops Lungs: CTAB no crackles, wheeze, rhonchi Abdomen: soft/nontender/nondistended/normal bowel sounds. No rebound or guarding.  Ext: no edema Skin: warm, dry, red nose and into cheeks- also with some papules down onto chin.  Neuro: 5/5 strength in upper and lower extremities, normal gait, normal reflexes  Assessment/Plan:  Chronic cough - Plan: DG Chest 2 View, Ambulatory referral to Pulmonology S:Cough since may of last year. Sometimes productive, sometimes not. Went to urgent care in may. Tussionex is only thing that helps to let her get to sleep. Lasted since may- uses very small amount. Works night shift. Has a friend who is a pulmonologist consider pepcid before bed- has not tried that yet. No watery itchy eyes or runny nose. No sore throat. Taking zyrtec for several months and didn't help. Does hear wheeze but sounds like upper airways. Albuterol didn't help. Tried prednisone through e visit and didn't help.  A/P:  DDX (no response to zyrtec so doubt allergic), no ace-I, no history asthma and no wheeze though is possible, does have some acid reflux symptoms so could be cause)  Get CXR given duration  Refer to pulmonology  Refill tussionex which has helped her in the past- for sparing use only (used a bottle in span of over 7 months so doubt misuse)   Rash S:facial rash since December- redness on bilateral cheeks (ski mask distribution) but also some papules and pustules down into chin. No scaling recently.  ROS-not ill appearing, no fever/chills. No new medications. Not immunocompromised. No mucus membrane involvement.  A/P: possible rosacea- trial metrogel. If no improvement in 4 weeks would have patient see dermatology   Orders Placed This Encounter  Procedures  . DG Chest 2  View    Standing Status:   Future    Standing Expiration Date:   08/28/2017    Order Specific Question:   Reason for Exam (SYMPTOM  OR DIAGNOSIS REQUIRED)    Answer:   cough since may 2017    Order Specific Question:   Is patient pregnant?    Answer:   No    Comments:   IUD    Order Specific Question:   Preferred imaging location?    Answer:   [facility]  . Ambulatory referral to Pulmonology    Referral Priority:   Routine    Referral Type:   Consultation    Referral Reason:   Specialty Services Required    Requested Specialty:   Pulmonary Disease    Number of Visits Requested:   1    Meds ordered this encounter  Medications  . metroNIDAZOLE (METROGEL) 0.75 % gel    Sig: Apply and rub a  thin film twice daily, morning and evening, to entire affected areas after washing    Dispense:  45 g    Refill:  1  . famotidine (PEPCID) 20 MG tablet    Sig: Take 1 tablet (20 mg total) by mouth 2 (two) times daily.    Dispense:  60 tablet    Refill:  2  . chlorpheniramine-HYDROcodone (TUSSIONEX PENNKINETIC ER) 10-8 MG/5ML SUER    Sig: Take 5 mLs by mouth every 12 (twelve) hours as needed for cough.    Dispense:  180 mL    Refill:  0    GENERIC EQUIVALENT    Return precautions advised.  Tana Conch, MD

## 2016-07-01 ENCOUNTER — Encounter: Payer: Self-pay | Admitting: Obstetrics & Gynecology

## 2016-07-01 MED FILL — HYDROCODONE-CHLORPHENIRAM S: 10-8 | 18 days supply | Qty: 180 | Fill #0

## 2016-07-04 ENCOUNTER — Encounter: Payer: Self-pay | Admitting: Family Medicine

## 2016-07-05 MED ORDER — PIMECROLIMUS 1 % EX CREA: TOPICAL_CREAM | Freq: Two times a day (BID) | CUTANEOUS | 0 refills | Status: DC

## 2016-07-06 ENCOUNTER — Telehealth: Payer: Self-pay

## 2016-07-06 NOTE — Telephone Encounter (Signed)
Received PA request for Elidel 1% cream from [facility]. PA submitted & is pending. Key: [id number]

## 2016-07-09 NOTE — Telephone Encounter (Signed)
Please inform patient of denial of PA, if she would prefer we could do a referral to dermatology. I usually do not use steroids on the face

## 2016-07-09 NOTE — Telephone Encounter (Signed)
PA denied. Approval requires a trial of a topical corticosteroid.

## 2016-07-13 NOTE — Telephone Encounter (Signed)
She sent a My Chart message that she had already made an appointment with Dermatology

## 2016-07-14 MED FILL — ELIDEL 1% CREAM: 1 | 30 days supply | Qty: 30 | Fill #0

## 2016-07-14 NOTE — Telephone Encounter (Signed)
Received the approval for the Elidel today from the appeal that was completed. Form faxed back to pharmacy letting them know PA is approved.

## 2016-07-23 ENCOUNTER — Ambulatory Visit (INDEPENDENT_AMBULATORY_CARE_PROVIDER_SITE_OTHER): Payer: 59 | Admitting: Internal Medicine

## 2016-07-23 ENCOUNTER — Encounter: Payer: Self-pay | Admitting: Internal Medicine

## 2016-07-23 VITALS — BP 112/70 | HR 75 | Ht 61.0 in | Wt 155.2 lb

## 2016-07-23 DIAGNOSIS — R058 Other specified cough: Secondary | ICD-10-CM

## 2016-07-23 DIAGNOSIS — R05 Cough: Secondary | ICD-10-CM | POA: Diagnosis not present

## 2016-07-23 LAB — NITRIC OXIDE: Nitric Oxide: 13

## 2016-07-23 MED ORDER — PREDNISONE 10 MG PO TABS: ORAL_TABLET | ORAL | 0 refills | Status: DC

## 2016-07-23 MED ORDER — FAMOTIDINE 20 MG PO TABS: ORAL_TABLET | ORAL | 2 refills | Status: DC

## 2016-07-23 MED ORDER — PANTOPRAZOLE SODIUM 40 MG PO TBEC: 40.0000 mg | DELAYED_RELEASE_TABLET | Freq: Every day | ORAL | 2 refills | Status: DC

## 2016-07-23 MED FILL — predniSONE 10 MG TABS: 10 | 6 days supply | Qty: 14 | Fill #0

## 2016-07-23 MED FILL — PANTOPRAZOLE SOD DR 40 MG T: 40 | 30 days supply | Qty: 30 | Fill #0

## 2016-07-23 NOTE — Patient Instructions (Signed)
The key to effective treatment for your cough is eliminating the non-stop cycle of cough you're stuck in long enough to let your airway heal completely and then see if there is anything still making you cough once you stop the cough suppression, but this should take no more than 5 days to figure out  First take delsym two tsp every 12 hours and supplement if needed with  tramadol 50 mg up to 2 every 4 hours to suppress the urge to cough at all or even clear your throat. Swallowing water or using ice chips/non mint and menthol containing candies (such as lifesavers or sugarless jolly ranchers) are also effective.  You should rest your voice and avoid activities that you know make you cough.  Once you have eliminated the cough for 3 straight days try reducing the tramadol first,  then the delsym as tolerated.    Prednisone 10 mg take  4 each am x 2 days,   2 each am x 2 days,  1 each am x 2 days and stop (this is to eliminate allergies and inflammation from coughing)  Protonix (pantoprazole) Take 30-60 min before first meal of the day and Pepcid 20 mg one bedtime    until cough is completely gone for at least a week without the need for cough suppression  GERD (REFLUX)  is an extremely common cause of respiratory symptoms, many times with no significant heartburn at all.    It can be treated with medication, but also with lifestyle changes including avoidance of late meals, excessive alcohol, smoking cessation, and avoid fatty foods, chocolate, peppermint, colas, red wine, and acidic juices such as orange juice.  NO MINT OR MENTHOL PRODUCTS SO NO COUGH DROPS  USE HARD CANDY INSTEAD (jolley ranchers or Stover's or Lifesavers (all available in sugarless versions) NO OIL BASED VITAMINS - use powdered substitutes.  If not better call libby at [phone] for sinus CT

## 2016-07-23 NOTE — Progress Notes (Signed)
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 Pulmonary Consult    Referred by Dr. Tana Estrada.  Pt c/o cough since May 2017- occ prod with clear sputum.   on pepcid after waking and bedtime and no change  In cough    Kouffman Reflux v Neurogenic Cough Differentiator Reflux Comments  Do you awaken from a sound sleep coughing violently?                            With trouble breathing? Yes  sometimes   Do you have choking episodes when you cannot  Get enough air, gasping for air ?              Yes Has vomited    Do you usually cough when you lie down into  The bed, or when you just lie down to rest ?                          Yes, that's the worst but sleep ok then worse p stirs   Do you usually cough after meals or eating?         not   Do you cough when (or after) you bend over?    no   GERD SCORE     Kouffman Reflux v Neurogenic Cough Differentiator Neurogenic   Do you more-or-less cough all day long? sporadic   Does change of temperature make you cough? Cold air   Does laughing or chuckling cause you to cough? No    Do fumes (perfume, automobile fumes, burned  Toast, etc.,) cause you to cough ?      no   Does speaking, singing, or talking on the phone cause you to cough   ?               No    Neurogenic/Airway score      Not limited by breathing from  desired activities    No obvious day to day or daytime variability or assoc excess/ purulent sputum or mucus plugs or hemoptysis or cp or chest tightness, subjective wheeze or overt sinus or hb symptoms. No unusual exp hx or h/o childhood pna/ asthma or knowledge of premature birth.    Also denies any obvious fluctuation of symptoms with weather or environmental changes or other aggravating or alleviating factors except as outlined above   Current Medications, Allergies, Complete Past Medical History, Past Surgical History, Family History, and Social History were reviewed in [employer] record.  ROS  The following are not active complaints unless bolded sore throat, dysphagia, dental problems, itching, sneezing,  nasal congestion or excess/ purulent secretions, ear ache,   fever, chills, sweats, unintended wt  loss, classically pleuritic or exertional cp,  orthopnea pnd or leg swelling, presyncope, palpitations, abdominal pain, anorexia, nausea, vomiting, diarrhea  or change in bowel or bladder habits, change in stools or urine, dysuria,hematuria,  rash, arthralgias, visual complaints, headache, numbness, weakness or ataxia or problems with walking or coordination,  change in mood/affect or memory.         Review of Systems     Objective:   Physical Exam    amb wf nad  Wt Readings from Last 3 Encounters:  07/23/16 155 lb 3.2 oz (70.4 kg)  06/30/16 156 lb 12.8 oz (71.1 kg)  10/08/15 149 lb (67.6 kg)    Vital signs reviewed - Note on arrival 02 sats  100% on RA     HEENT: nl dentition, turbinates bilaterally, and oropharynx. Nl external ear canals without cough reflex   NECK :  without JVD/Nodes/TM/ nl carotid upstrokes bilaterally   LUNGS: no acc muscle use,  Nl contour chest which is clear to A and P bilaterally without cough on insp or exp maneuvers   CV:  RRR  no s3 or murmur or increase in P2, and no edema   ABD:  soft and nontender with nl  inspiratory excursion in the supine position. No bruits or organomegaly appreciated, bowel sounds nl  MS:  Nl gait/ ext warm without deformities, calf tenderness, cyanosis or clubbing No obvious joint restrictions   SKIN: warm and dry without lesions    NEURO:  alert, approp, nl sensorium with  no motor or cerebellar deficits apparent.     I personally reviewed images and agree with radiology impression as follows:  CXR:   06/30/16 No active cardiopulmonary disease.      Assessment:

## 2016-07-25 NOTE — Assessment & Plan Note (Addendum)
FENO 07/23/2016  =   13  Cyclical cough regimen 07/23/2016 >>>  The most common causes of chronic cough in immunocompetent adults include the following: upper airway cough syndrome (UACS), previously referred to as postnasal drip syndrome (PNDS), which is caused by variety of rhinosinus conditions; (2) asthma; (3) GERD; (4) chronic bronchitis from cigarette smoking or other inhaled environmental irritants; (5) nonasthmatic eosinophilic bronchitis; and (6) bronchiectasis.   These conditions, singly or in combination, have accounted for up to 94% of the causes of chronic cough in prospective studies.   Other conditions have constituted no >6% of the causes in prospective studies These have included bronchogenic carcinoma, chronic interstitial pneumonia, sarcoidosis, left ventricular failure, ACEI-induced cough, and aspiration from a condition associated with pharyngeal dysfunction.    Chronic cough is often simultaneously caused by more than one condition. A single cause has been found from 38 to 82% of the time, multiple causes from 18 to 62%. Multiply caused cough has been the result of three diseases up to 42% of the time.       Based on the hx and low feno most likely this is not asthma but Upper airway cough syndrome (previously labeled PNDS) , is  so named because it's frequently impossible to sort out how much is  CR/sinusitis with freq throat clearing (which can be related to primary GERD)   vs  causing  secondary (" extra esophageal")  GERD from wide swings in gastric pressure that occur with throat clearing, often  promoting self use of mint and menthol lozenges that reduce the lower esophageal sphincter tone and exacerbate the problem further in a cyclical fashion.   These are the same pts (now being labeled as having "irritable larynx syndrome" by some cough centers) who not infrequently have a history of having failed to tolerate ace inhibitors,  dry powder inhalers or biphosphonates or  report having atypical/extraesophageal reflux symptoms that don't respond to standard doses of PPI  and are easily confused as having aecopd or asthma flares by even experienced allergists/ pulmonologists (myself included).  Of the three most common causes of chronic cough, only one (GERD)  can actually cause the other two (asthma and post nasal drip syndrome)  and perpetuate the cylce of cough inducing airway trauma, inflammation, heightened sensitivity to reflux which is prompted by the cough itself via a cyclical mechanism.    This may partially respond to steroids and look like asthma and post nasal drainage but never erradicated completely unless the cough and the secondary reflux are eliminated, preferably both at the same time.  While not intuitively obvious, many patients with chronic low grade reflux do not cough until there is a secondary insult that disturbs the protective epithelial barrier and exposes sensitive nerve endings.  This can be viral or direct physical injury such as with an endotracheal tube.   The point is that once this occurs, it is difficult to eliminate using anything but a maximally effective acid suppression regimen at least in the short run, accompanied by an appropriate diet to address non acid GERD.  If not better p cyclical cough rx/ next steps are sinus CT then MCT vs trial of gabapentin for irritable larynx syndrome   Total time devoted to counseling  > 50 % of initial 60 min office visit:  review case with pt/ discussion of options/alternatives/ personally creating written customized instructions  in presence of pt  then going over those specific  Instructions directly with the pt including how  to use all of the meds but in particular covering each new medication in detail and the difference between the maintenance= "automatic" meds and the prns using an action plan format for the latter (If this problem/symptom => do that organization reading Left to right).  Please  see AVS from this visit for a full list of these instructions which I personally wrote for this pt and  are unique to this visit.

## 2016-07-26 DIAGNOSIS — L710 Perioral dermatitis: Secondary | ICD-10-CM | POA: Diagnosis not present

## 2016-07-26 MED FILL — FAMOTIDINE 20 MG TABLET: 20 | 30 days supply | Qty: 30 | Fill #0

## 2016-07-26 MED FILL — DOXYCYCLINE HYCLATE 100 MG: 100 | 30 days supply | Qty: 60 | Fill #0

## 2016-08-02 ENCOUNTER — Encounter: Payer: Self-pay | Admitting: Family Medicine

## 2017-07-19 ENCOUNTER — Encounter: Payer: Self-pay | Admitting: Obstetrics & Gynecology

## 2017-08-04 ENCOUNTER — Encounter: Payer: Self-pay | Admitting: Obstetrics & Gynecology

## 2017-08-04 ENCOUNTER — Ambulatory Visit (INDEPENDENT_AMBULATORY_CARE_PROVIDER_SITE_OTHER): Payer: No Typology Code available for payment source | Admitting: Obstetrics & Gynecology

## 2017-08-04 VITALS — BP 113/81 | HR 75 | Ht 61.0 in | Wt 155.0 lb

## 2017-08-04 DIAGNOSIS — Z30432 Encounter for removal of intrauterine contraceptive device: Secondary | ICD-10-CM

## 2017-08-04 NOTE — Progress Notes (Signed)
Patient ID: Brandi Estrada, female   DOB: May 15, 1986, 32 y.o.   MRN: [id number]  No chief complaint on file.   HPI Brandi Estrada is a 32 y.o. female.   HPI 32 yo engaged G0 here today to have her 32 yo [place] removed. She has vaginal dryness and would like to use condoms prn.  Past Medical History:  Diagnosis Date  . Chicken pox   . Heartburn   . Jaundice    as baby    Past Surgical History:  Procedure Laterality Date  . WISDOM TOOTH EXTRACTION      Family History  Problem Relation Age of Onset  . Cancer Mother        breast - Premenopause. age 65. DCIS  . Colonic polyp Mother   . Diabetes Father   . Hemachromatosis Father   . Hyperlipidemia Father   . Hypertension Father   . Diabetes Paternal Grandfather        committed suicide  . Cancer Maternal Grandmother        colon. mom gets early screenigns  . Heart disease Maternal Grandfather        heart surgery bypass  . Heart disease Maternal Uncle        heart trannsplant. died when stopped taking meds  . Cancer - Cervical Maternal Aunt   . Cancer Maternal Aunt        bone  . Stroke Paternal Grandmother     Social History Social History   Tobacco Use  . Smoking status: Never Smoker  . Smokeless tobacco: Never Used  Substance Use Topics  . Alcohol use: Yes    Alcohol/week: 0.0 - 0.6 oz  . Drug use: No    No Known Allergies  Current Outpatient Medications  Medication Sig Dispense Refill  . albuterol (PROVENTIL HFA;VENTOLIN HFA) 108 (90 Base) MCG/ACT inhaler Inhale 2 puffs into the lungs every 6 (six) hours as needed. 1 Inhaler 1  . cetirizine (ZYRTEC) 10 MG tablet Take 10 mg by mouth daily.    . chlorpheniramine-HYDROcodone (TUSSIONEX PENNKINETIC ER) 10-8 MG/5ML SUER Take 5 mLs by mouth every 12 (twelve) hours as needed for cough. 180 mL 0  . famotidine (PEPCID) 20 MG tablet Take 1 tablet (20 mg total) by mouth 2 (two) times daily. 60 tablet 2  . famotidine (PEPCID) 20 MG tablet One at bedtime 30 tablet  2  . Multiple Vitamin (MULTIVITAMIN) tablet Take 1 tablet by mouth daily.    . pantoprazole (PROTONIX) 40 MG tablet Take 1 tablet (40 mg total) by mouth daily. Take 30-60 min before first meal of the day 30 tablet 2  . pimecrolimus (ELIDEL) 1 % cream Apply topically 2 (two) times daily. 30 g 0  . predniSONE (DELTASONE) 10 MG tablet Take  4 each am x 2 days,   2 each am x 2 days,  1 each am x 2 days and stop 14 tablet 0  . Probiotic Product (PROBIOTIC PO) Take 1 capsule by mouth daily.     No current facility-administered medications for this visit.     Review of Systems Review of Systems  There were no vitals taken for this visit.  Physical Exam Physical Exam Breathing, conversing, and ambulating normally Well nourished, well hydrated White female, no apparent distress Intact Skyla IUD removed easily She tolerated the procedure well. Data Reviewed  Diagnosis NEGATIVE FOR INTRAEPITHELIAL LESIONS OR MALIGNANCY. DEBRA GIBSON Cytotechnologist Electronic Signature (Case signed 09/02/2014) Assessment    Contraception  Plan    Condoms  Rec MVI daily Come back 1 year       Allie BossierMyra C Benyamin Jeff 08/04/2017, 3:34 PM

## 2017-10-14 ENCOUNTER — Encounter: Payer: Self-pay | Admitting: Family Medicine

## 2017-10-19 ENCOUNTER — Encounter: Payer: Self-pay | Admitting: Family Medicine

## 2017-11-13 ENCOUNTER — Encounter: Payer: Self-pay | Admitting: Obstetrics & Gynecology

## 2017-11-22 ENCOUNTER — Encounter: Payer: Self-pay | Admitting: Family Medicine

## 2017-11-22 ENCOUNTER — Ambulatory Visit (INDEPENDENT_AMBULATORY_CARE_PROVIDER_SITE_OTHER): Payer: No Typology Code available for payment source | Admitting: Obstetrics & Gynecology

## 2017-11-22 ENCOUNTER — Encounter: Payer: Self-pay | Admitting: Obstetrics & Gynecology

## 2017-11-22 VITALS — BP 109/77 | HR 68 | Ht 61.0 in | Wt 162.0 lb

## 2017-11-22 DIAGNOSIS — Z124 Encounter for screening for malignant neoplasm of cervix: Secondary | ICD-10-CM

## 2017-11-22 DIAGNOSIS — Z01419 Encounter for gynecological examination (general) (routine) without abnormal findings: Secondary | ICD-10-CM | POA: Diagnosis not present

## 2017-11-22 DIAGNOSIS — Z1151 Encounter for screening for human papillomavirus (HPV): Secondary | ICD-10-CM

## 2017-11-22 DIAGNOSIS — Z23 Encounter for immunization: Secondary | ICD-10-CM | POA: Diagnosis not present

## 2017-11-22 NOTE — Progress Notes (Signed)
Last pap smear 08/29/14- negative

## 2017-11-22 NOTE — Progress Notes (Signed)
Subjective:    [name] is a 32 y.o.  Divorced P0 who presents for an annual exam. The patient has no complaints today. The patient is sexually active. GYN screening history: last pap: was normal. The patient wears seatbelts: yes. The patient participates in regular exercise: yes. Has the patient ever been transfused or tattooed?: yes. The patient reports that there is not domestic violence in her life.   Menstrual History: OB History    Gravida  0   Para  0   Term  0   Preterm  0   AB  0   Living  0     SAB  0   TAB  0   Ectopic  0   Multiple  0   Live Births              Menarche age: 62 Patient's last menstrual period was 10/23/2017.    The following portions of the patient's history were reviewed and updated as appropriate: allergies, current medications, past family history, past medical history, past social history, past surgical history and problem list.  Review of Systems Pertinent items are noted in HPI.   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   Objective:    BP 109/77   Pulse 68   Ht '5\' 1"'  (1.549 m)   Wt 162 lb (73.5 kg)   LMP 10/23/2017   BMI 30.61 kg/m   General Appearance:    Alert, cooperative, no distress, appears stated age  Head:    Normocephalic, without obvious abnormality, atraumatic  Eyes:    PERRL, conjunctiva/corneas clear, EOM's intact, fundi    benign, both eyes  Ears:    Normal TM's and external ear canals, both ears  Nose:   Nares normal, septum midline, mucosa normal, no drainage    or sinus tenderness  Throat:   Lips, mucosa, and tongue normal; teeth and gums normal  Neck:   Supple, symmetrical, trachea midline, no adenopathy;    thyroid:  no enlargement/tenderness/nodules; no carotid   bruit or JVD  Back:     Symmetric, no curvature, ROM normal, no CVA  tenderness  Lungs:     Clear to auscultation bilaterally, respirations unlabored  Chest Wall:    No tenderness or deformity   Heart:    Regular rate and rhythm, S1 and S2 normal, no murmur, rub   or gallop  Breast Exam:    No tenderness, masses, or nipple abnormality  Abdomen:     Soft, non-tender, bowel sounds active all four quadrants,    no masses, no organomegaly  Genitalia:    Normal female without lesion, discharge or tenderness, left vaginal wall cyst, normal size and shape, anteverted, mobile, non-tender, normal adnexal exam      Extremities:   Extremities normal, atraumatic, no cyanosis or edema  Pulses:   2+ and symmetric all extremities  Skin:   Skin color, texture, turgor normal, no rashes or lesions  Lymph nodes:   Cervical, supraclavicular, and axillary nodes normal  Neurologic:   CNII-XII intact, normal strength, sensation and reflexes    throughout  .    Assessment:    Healthy female exam.    Plan:     Thin prep Pap smear. with cotesting Rec'd Gardasil tdap

## 2017-11-24 LAB — CYTOLOGY - PAP
Diagnosis: NEGATIVE
HPV: NOT DETECTED

## 2017-11-30 ENCOUNTER — Encounter: Payer: Self-pay | Admitting: Obstetrics & Gynecology

## 2017-12-19 ENCOUNTER — Other Ambulatory Visit: Payer: Self-pay | Admitting: Obstetrics & Gynecology

## 2017-12-19 MED ORDER — NORGESTREL-ETHINYL ESTRADIOL 0.3-30 MG-MCG PO TABS: 1.0000 | ORAL_TABLET | Freq: Every day | ORAL | 11 refills | Status: DC

## 2017-12-19 NOTE — Progress Notes (Signed)
Lo ovral called in to be taken if she is still having irregular bleeding. She recently got a Mirena and tried megace BID to help with the bleeding. This is not working.

## 2018-06-07 ENCOUNTER — Encounter: Payer: Self-pay | Admitting: Certified Nurse Midwife

## 2018-06-07 ENCOUNTER — Ambulatory Visit (INDEPENDENT_AMBULATORY_CARE_PROVIDER_SITE_OTHER): Payer: No Typology Code available for payment source | Admitting: Certified Nurse Midwife

## 2018-06-07 VITALS — BP 115/79 | HR 70 | Ht 61.0 in | Wt 166.0 lb

## 2018-06-07 DIAGNOSIS — Z3202 Encounter for pregnancy test, result negative: Secondary | ICD-10-CM | POA: Diagnosis not present

## 2018-06-07 DIAGNOSIS — N912 Amenorrhea, unspecified: Secondary | ICD-10-CM

## 2018-06-07 DIAGNOSIS — N911 Secondary amenorrhea: Secondary | ICD-10-CM

## 2018-06-07 LAB — POCT URINE PREGNANCY: Preg Test, Ur: NEGATIVE

## 2018-06-07 NOTE — Progress Notes (Signed)
   GYNECOLOGY OFFICE VISIT NOTE  History:  33 y.o. G72P0000 female here today for amenorrhea. Reports LMP 04/13/18, no menses in December. She is using condoms but reports one episode of unprotected IC in early December. She is not planning pregnancy at this time. No new partner. She denies any abnormal vaginal discharge, bleeding, pelvic pain or other concerns. No hx of amenorrhea or PCOS. Had Skyla until spring of last year, no hormonal contraception since.  Past Medical History:  Diagnosis Date  . Chicken pox   . Heartburn   . Jaundice    as baby    Past Surgical History:  Procedure Laterality Date  . WISDOM TOOTH EXTRACTION      The following portions of the patient's history were reviewed and updated as appropriate: allergies, current medications, past family history, past medical history, past social history, past surgical history and problem list.   Health Maintenance:  Normal pap and negative HRHPV on 11/22/17.     Review of Systems:  Genito-Urinary ROS: positive for - change in menstrual cycle negative for - dyspareunia or pelvic pain   Objective:  Physical Exam BP 115/79   Pulse 70   Ht 5\' 1"  (1.549 m)   Wt 75.3 kg   LMP 04/11/2018   BMI 31.37 kg/m  CONSTITUTIONAL: Well-developed, well-nourished female in no acute distress.  HENT:  Normocephalic, atraumatic EYES: Conjunctivae and EOM are normal NECK: Normal range of motion SKIN: Skin is warm and dry NEUROLOGIC: Alert and oriented to person, place, and time PSYCHIATRIC: Normal mood and affect CARDIOVASCULAR: Normal heart rate noted RESPIRATORY: Effort and rate normal ABDOMEN: Soft, no distention  PELVIC: Normal appearing external genitalia; normal appearing vaginal mucosa and cervix.  No abnormal discharge noted.  Normal uterine size, no other palpable masses, no uterine or adnexal tenderness. MUSCULOSKELETAL: Normal range of motion  Labs and Imaging Results for orders placed or performed in visit on 06/07/18  (from the past 24 hour(s))  POCT urine pregnancy     Status: None   Collection Time: 06/07/18 11:06 AM  Result Value Ref Range   Preg Test, Ur Negative Negative   Assessment & Plan:  Secondary amenorrhea - discussed with pt and partner it can be normal to miss a menses during reproductive years, intermittent anovulation can occur - no signs of pregnancy today  Orders Placed This Encounter  Procedures  . TSH  . Prolactin  . FSH  . B-HCG Quant  . POCT urine pregnancy   Continue consistent condom use Follow up as needed   Total face-to-face time with patient: 15 minutes   Donette Larry, [place] 06/07/2018 12:03 PM

## 2018-06-08 LAB — FOLLICLE STIMULATING HORMONE: FSH: 4.8 m[IU]/mL

## 2018-06-08 LAB — PROLACTIN: Prolactin: 13.4 ng/mL

## 2018-06-08 LAB — HCG, QUANTITATIVE, PREGNANCY: HCG, Total, QN: <2 m[IU]/mL

## 2018-06-08 LAB — EXTRA LAV TOP TUBE

## 2018-06-08 LAB — TSH: TSH: 3.88 mIU/L

## 2018-06-09 ENCOUNTER — Encounter: Payer: Self-pay | Admitting: Certified Nurse Midwife

## 2018-12-30 IMAGING — DX DG CHEST 2V
2 series · 2 of 2 positions shown · non-contrast
Comparison: None.

CLINICAL DATA: Chronic dry cough for 7 months.

EXAM:
CHEST  2 VIEW

[chest pa]
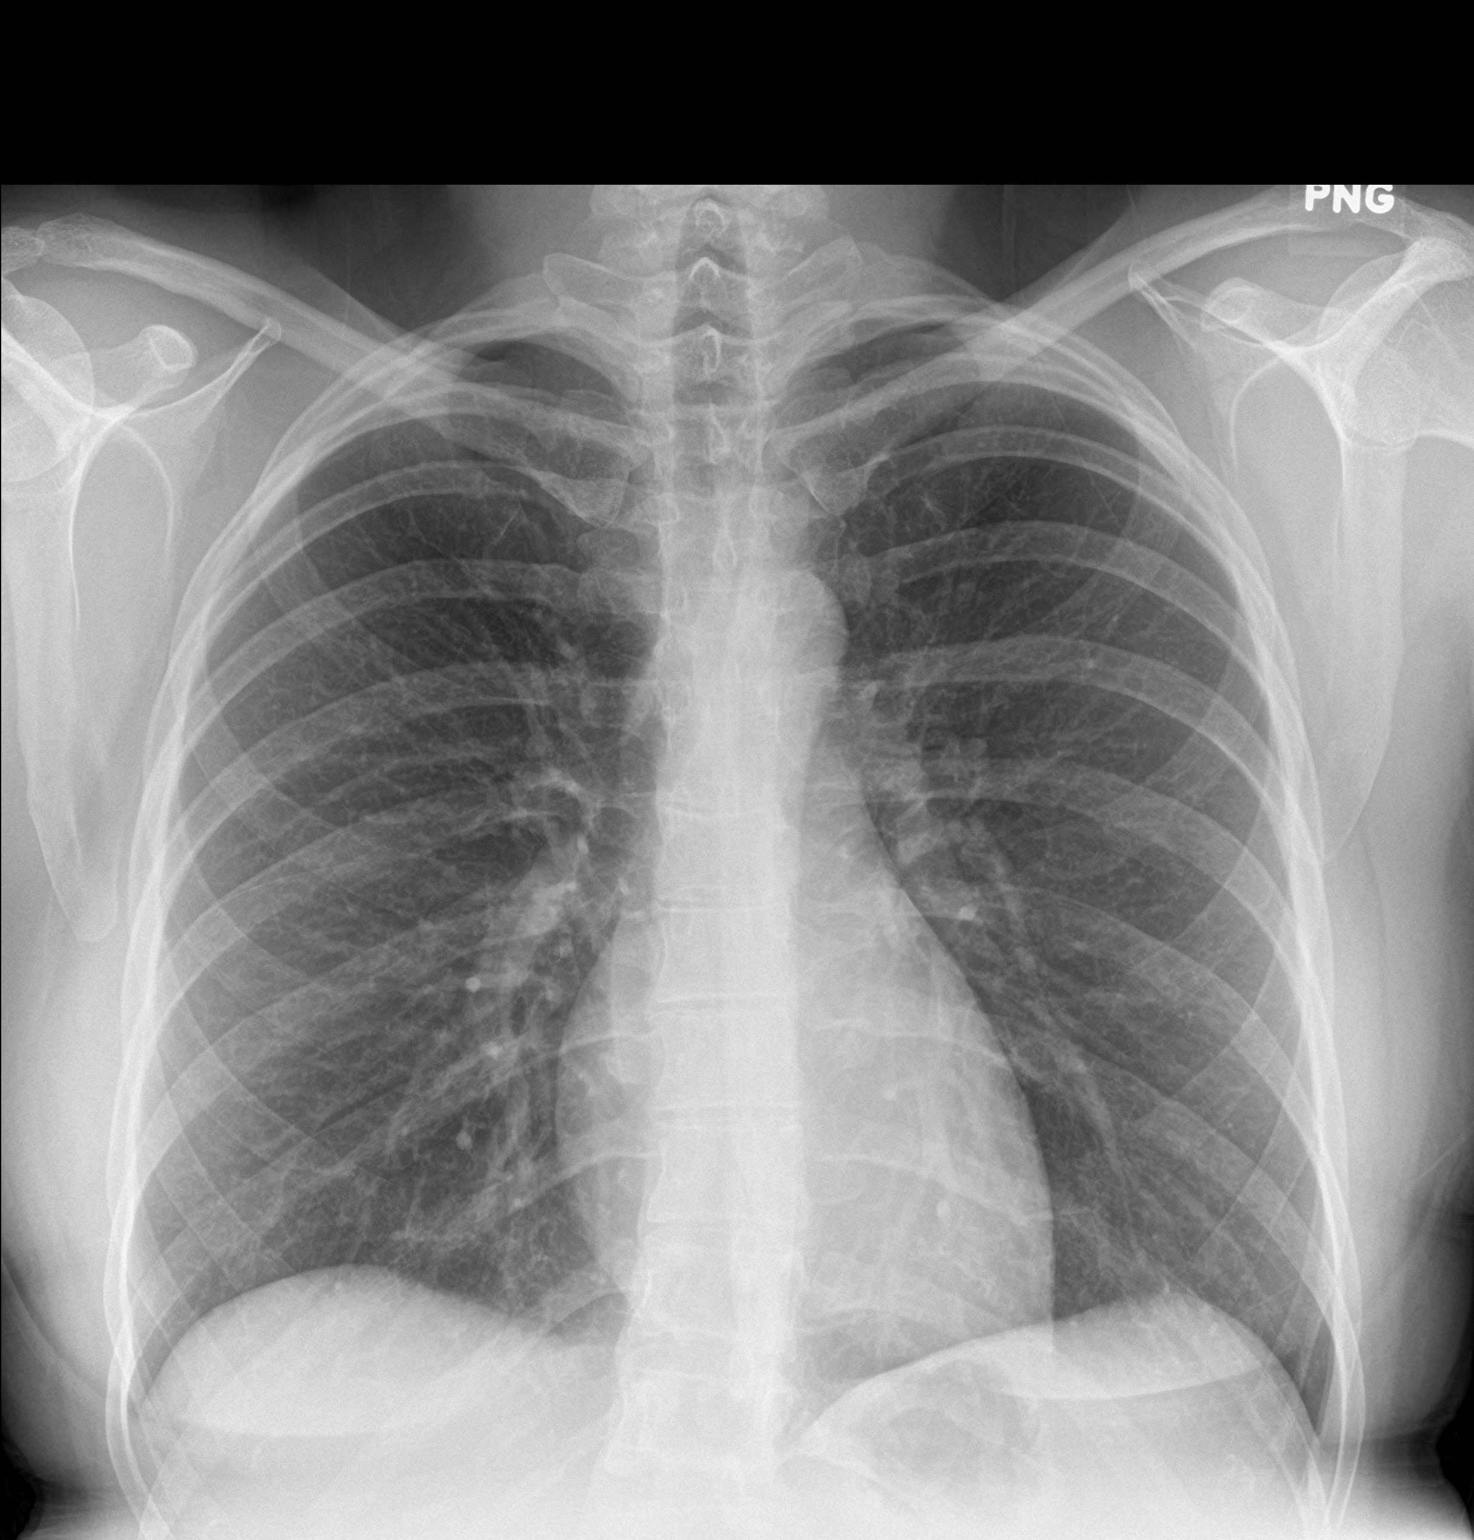

[chest lat]
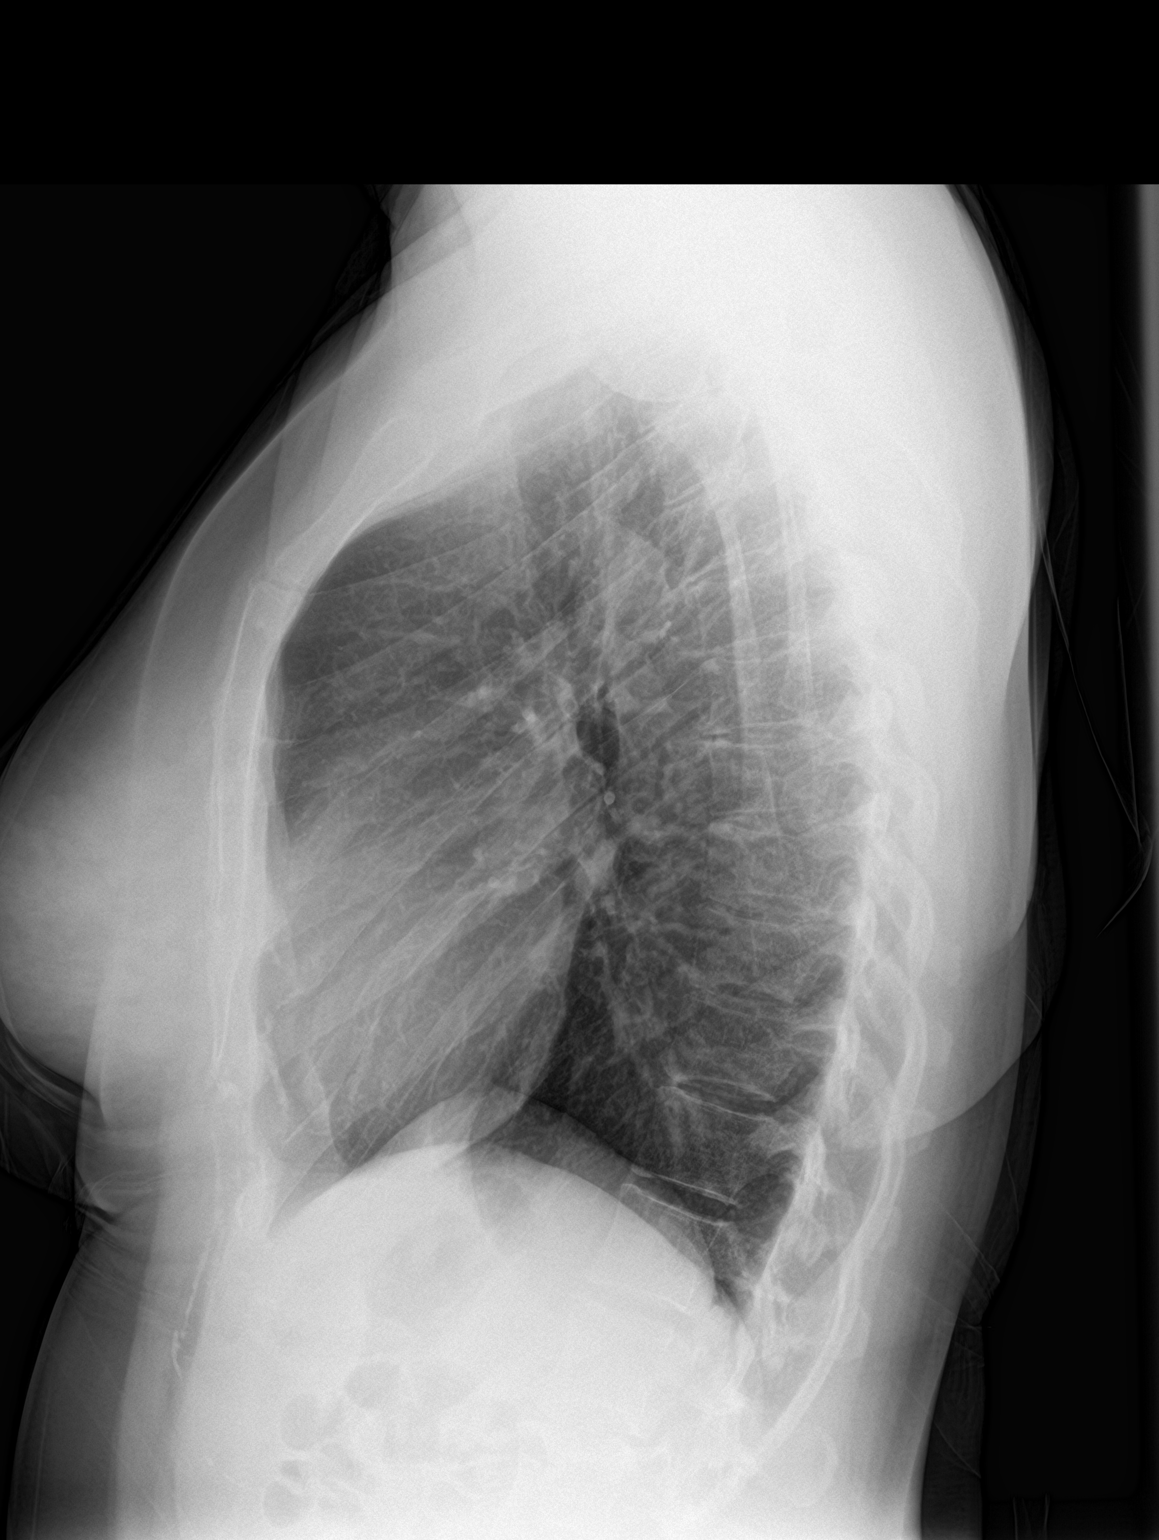

[2 of 2 positions shown; findings below may reference images not displayed]

FINDINGS: Heart and mediastinal contours are within normal limits. No focal
opacities or effusions. No acute bony abnormality.
IMPRESSION: No active cardiopulmonary disease.

## 2019-05-10 ENCOUNTER — Encounter: Payer: Self-pay | Admitting: Obstetrics & Gynecology

## 2019-05-10 ENCOUNTER — Other Ambulatory Visit: Payer: Self-pay

## 2019-05-10 ENCOUNTER — Ambulatory Visit (INDEPENDENT_AMBULATORY_CARE_PROVIDER_SITE_OTHER): Payer: No Typology Code available for payment source | Admitting: Obstetrics & Gynecology

## 2019-05-10 VITALS — BP 130/80 | HR 75 | Temp 98.0°F | Resp 16 | Ht 61.0 in | Wt 173.0 lb

## 2019-05-10 DIAGNOSIS — Z1151 Encounter for screening for human papillomavirus (HPV): Secondary | ICD-10-CM

## 2019-05-10 DIAGNOSIS — Z01419 Encounter for gynecological examination (general) (routine) without abnormal findings: Secondary | ICD-10-CM | POA: Diagnosis not present

## 2019-05-10 DIAGNOSIS — Z124 Encounter for screening for malignant neoplasm of cervix: Secondary | ICD-10-CM | POA: Diagnosis not present

## 2019-05-10 NOTE — Progress Notes (Signed)
Subjective:    MAYZIE CAUGHLIN is a 33 y.o. single G0 who presents for an annual exam. The patient has no complaints today. The patient is sexually active. GYN screening history: last pap: was normal. The patient wears seatbelts: yes. The patient participates in regular exercise: yes. Has the patient ever been transfused or tattooed?: yes. The patient reports that there is not domestic violence in her life.   Menstrual History: OB History    Gravida  0   Para  0   Term  0   Preterm  0   AB  0   Living  0     SAB  0   TAB  0   Ectopic  0   Multiple  0   Live Births              Menarche age: 82 Patient's last menstrual period was 05/06/2019.    The following portions of the patient's history were reviewed and updated as appropriate: allergies, current medications, past family history, past medical history, past social history, past surgical history and problem list.  Review of Systems Pertinent items are noted in HPI.   FH- + breast in mom, ovarian cancer in maternal aunt, colon cancer in maternal GM Negative BRCA testing Works on [employer] at [employer] She had her flu vaccine. She uses condoms prn   Objective:    BP 130/80   Pulse 75   Temp 98 F (36.7 C) (Oral)   Resp 16   Ht '5\' 1"'  (1.549 m)   Wt 173 lb (78.5 kg)   LMP 05/06/2019   BMI 32.69 kg/m   General Appearance:    Alert, cooperative, no distress, appears stated age  Head:    Normocephalic, without obvious abnormality, atraumatic  Eyes:    PERRL, conjunctiva/corneas clear, EOM's intact, fundi    benign, both eyes  Ears:    Normal TM's and external ear canals, both ears  Nose:   Nares normal, septum midline, mucosa normal, no drainage    or sinus tenderness  Throat:   Lips, mucosa, and tongue normal; teeth and gums normal  Neck:   Supple, symmetrical, trachea midline, no adenopathy;    thyroid:  no enlargement/tenderness/nodules; no carotid   bruit or JVD  Back:     Symmetric, no curvature,  ROM normal, no CVA tenderness  Lungs:     Clear to auscultation bilaterally, respirations unlabored  Chest Wall:    No tenderness or deformity   Heart:    Regular rate and rhythm, S1 and S2 normal, no murmur, rub   or gallop  Breast Exam:    No tenderness, masses, or nipple abnormality  Abdomen:     Soft, non-tender, bowel sounds active all four quadrants,    no masses, no organomegaly  Genitalia:    Normal female without lesion, discharge or tenderness, normal size and shape, anteverted, mobile, non-tender, normal adnexal exam      Extremities:   Extremities normal, atraumatic, no cyanosis or edema  Pulses:   2+ and symmetric all extremities  Skin:   Skin color, texture, turgor normal, no rashes or lesions  Lymph nodes:   Cervical, supraclavicular, and axillary nodes normal  Neurologic:   CNII-XII intact, normal strength, sensation and reflexes    throughout  .    Assessment:    Healthy female exam.    Plan:     Thin prep Pap smear. with cotesting Rec continue MVI in case she decides to  conceive Fasting labs today

## 2019-05-11 LAB — COMPREHENSIVE METABOLIC PANEL
AG Ratio: 1.7 (calc) (ref 1.0–2.5)
ALT: 17 U/L (ref 6–29)
AST: 19 U/L (ref 10–30)
Albumin: 4.4 g/dL (ref 3.6–5.1)
Alkaline phosphatase (APISO): 54 U/L (ref 31–125)
BUN: 13 mg/dL (ref 7–25)
CO2: 29 mmol/L (ref 20–32)
Calcium: 9.5 mg/dL (ref 8.6–10.2)
Chloride: 103 mmol/L (ref 98–110)
Creat: 0.64 mg/dL (ref 0.50–1.10)
Globulin: 2.6 g/dL (calc) (ref 1.9–3.7)
Glucose, Bld: 83 mg/dL (ref 65–99)
Potassium: 3.6 mmol/L (ref 3.5–5.3)
Sodium: 140 mmol/L (ref 135–146)
Total Bilirubin: 0.4 mg/dL (ref 0.2–1.2)
Total Protein: 7 g/dL (ref 6.1–8.1)

## 2019-05-11 LAB — CBC
HCT: 42.5 % (ref 35.0–45.0)
Hemoglobin: 14.6 g/dL (ref 11.7–15.5)
MCH: 30 pg (ref 27.0–33.0)
MCHC: 34.4 g/dL (ref 32.0–36.0)
MCV: 87.3 fL (ref 80.0–100.0)
MPV: 9 fL (ref 7.5–12.5)
Platelets: 334 10*3/uL (ref 140–400)
RBC: 4.87 10*6/uL (ref 3.80–5.10)
RDW: 12.3 % (ref 11.0–15.0)
WBC: 7.1 10*3/uL (ref 3.8–10.8)

## 2019-05-11 LAB — LIPID PANEL
Cholesterol: 178 mg/dL (ref <200)
HDL: 55 mg/dL (ref >=50)
LDL Cholesterol (Calc): 106 mg/dL (calc) — ABNORMAL HIGH
Non-HDL Cholesterol (Calc): 123 mg/dL (calc) (ref <130)
Total CHOL/HDL Ratio: 3.2 (calc) (ref <5.0)
Triglycerides: 82 mg/dL (ref <150)

## 2019-05-11 LAB — VITAMIN D 25 HYDROXY (VIT D DEFICIENCY, FRACTURES): Vit D, 25-Hydroxy: 27 ng/mL — ABNORMAL LOW (ref 30–100)

## 2019-05-11 LAB — TSH: TSH: 3 mIU/L

## 2019-05-14 ENCOUNTER — Other Ambulatory Visit: Payer: Self-pay | Admitting: Obstetrics & Gynecology

## 2019-05-14 MED ORDER — VITAMIN D (ERGOCALCIFEROL) 1.25 MG (50000 UNIT) PO CAPS: 50000.0000 [IU] | ORAL_CAPSULE | ORAL | 0 refills | Status: DC

## 2019-05-14 MED FILL — VIT D2 1.25 MG (50,000 UNIT: 1.25 MG | 84 days supply | Qty: 12 | Fill #0

## 2019-05-14 NOTE — Progress Notes (Signed)
Vitamin D prescribed for treatment of deficiency.

## 2019-05-15 LAB — CYTOLOGY - PAP
Comment: NEGATIVE
Diagnosis: UNDETERMINED — AB
High risk HPV: NEGATIVE

## 2019-06-20 ENCOUNTER — Encounter: Payer: Self-pay | Admitting: Family Medicine

## 2019-06-21 ENCOUNTER — Other Ambulatory Visit: Payer: Self-pay

## 2019-06-21 DIAGNOSIS — M25562 Pain in left knee: Secondary | ICD-10-CM

## 2019-06-28 ENCOUNTER — Ambulatory Visit: Payer: Self-pay

## 2019-06-28 ENCOUNTER — Ambulatory Visit: Payer: No Typology Code available for payment source | Admitting: Orthopaedic Surgery

## 2019-06-28 ENCOUNTER — Encounter: Payer: Self-pay | Admitting: Orthopaedic Surgery

## 2019-06-28 ENCOUNTER — Other Ambulatory Visit: Payer: Self-pay

## 2019-06-28 VITALS — Ht 61.0 in | Wt 173.0 lb

## 2019-06-28 DIAGNOSIS — G8929 Other chronic pain: Secondary | ICD-10-CM | POA: Diagnosis not present

## 2019-06-28 DIAGNOSIS — M25562 Pain in left knee: Secondary | ICD-10-CM | POA: Diagnosis not present

## 2019-06-28 DIAGNOSIS — M2242 Chondromalacia patellae, left knee: Secondary | ICD-10-CM | POA: Insufficient documentation

## 2019-06-28 NOTE — Progress Notes (Signed)
Office Visit Note   Patient: Brandi Estrada           Date of Birth: 07/26/1985           MRN: [id number] Visit Date: 06/28/2019              Requested by: Shelva Majestic, MD [address] [place],  [place] [address] PCP: Shelva Majestic, MD   Assessment & Plan: Visit Diagnoses:  1. Chronic pain of left knee   2. Chondromalacia patellae, left knee     Plan: Ms. Morash has had a problem in the left knee for "years.  No specific injury of trauma.  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  She is has occasional feeling of her knee giving out but popping and clicking.  She recently returned from a ski vacation and noted that she was having a lot of trouble towards the end of the day.  Her symptoms are consistent with chondromalacia patella.  We will try a course of physical therapy and if no improvement consider local cortisone injection.  Also have discussed NSAIDs and Voltaren gel.  Has tried a brace in the past but it tends to drift  Follow-Up Instructions: Return if symptoms worsen or fail to improve.   Orders:  Orders Placed This Encounter  Procedures  . XR KNEE 3 VIEW LEFT  . Ambulatory referral to Physical Therapy   No orders of the defined types were placed in this encounter.     Procedures: No procedures performed   Clinical Data: No additional findings.   Subjective: Chief Complaint  Patient presents with  . Left Knee - Pain  Patient presents today for left knee pain. She said that she injured her knee in middle school while in cheerleading and stretching her leg above her head. She said that she has had issues with her knee since. She said that it pops while riding the bike. She states that while walking at work it will occasionally give way. She takes Ibuprofen for pain. No swelling.  No history of patella dislocation or  subluxation.  No history of swelling.  No numbness or tingling.  Seems to have more trouble with her knee in a flexed position over period of time i.e. working or scanning.  No specific joint pain  HPI  Review of Systems   Objective: Vital Signs: Ht 5\' 1"  (1.549 m)   Wt 173 lb (78.5 kg)   LMP 06/10/2019   BMI 32.69 kg/m   Physical Exam Constitutional:      Appearance: She is well-developed.  Eyes:     Pupils: Pupils are equal, round, and reactive to light.  Pulmonary:     Effort: Pulmonary effort is normal.  Skin:    General: Skin is warm and dry.  Neurological:     Mental Status: She is alert and oriented to person, place, and time.  Psychiatric:        Behavior: Behavior normal.     Ortho Exam left knee was not hot red warm or swollen.  A little bit of crepitation with patella flexion extension.  Pain seems to be localized to the anterior aspect of the knee.  No medial lateral joint pain or popping or clicking.  No popliteal pain or mass.  No calf pain.  Neurologically intact.  Full extension and flexion.  No apprehension with patella motion.  No pain over small medial parapatellar plica.  Patella tendon intact.  Skin intact.  Specialty Comments:  No specialty comments available.  Imaging: XR KNEE 3 VIEW LEFT  Result Date: 06/28/2019 Films of the left knee were obtained in several projections.  No evidence of ectopic calcification or acute changes.  Joint spaces are well-maintained.  Possibly very small lateral femoral condyle osteophyte.  Lateral patella tilt.  Symptoms are consistent with chondromalacia patella which to some extent may be confirmed with a lateral tilt    PMFS History: Patient Active Problem List   Diagnosis Date Noted  . Chondromalacia patellae, left knee 06/28/2019  . Upper airway cough syndrome 07/23/2016   Past Medical History:  Diagnosis Date  . Chicken pox   . Heartburn   . Jaundice    as baby    Family History  Problem Relation Age of  Onset  . Cancer Mother        breast - Premenopause. age 92. DCIS  . Colonic polyp Mother   . Diabetes Father   . Hemachromatosis Father   . Hyperlipidemia Father   . Hypertension Father   . Diabetes Paternal Grandfather        committed suicide  . Cancer Maternal Grandmother        colon. mom gets early screenigns  . Heart disease Maternal Grandfather        heart surgery bypass  . Heart disease Maternal Uncle        heart trannsplant. died when stopped taking meds  . Cancer - Cervical Maternal Aunt   . Cancer Maternal Aunt        bone  . Stroke Paternal Grandmother     Past Surgical History:  Procedure Laterality Date  . WISDOM TOOTH EXTRACTION     Social History   Occupational History  . Occupation: Customer service manager  Tobacco Use  . Smoking status: Never Smoker  . Smokeless tobacco: Never Used  Substance and Sexual Activity  . Alcohol use: Yes    Alcohol/week: 0.0 - 1.0 standard drinks  . Drug use: No  . Sexual activity: Yes    Partners: Male    Birth control/protection: Condom

## 2019-07-04 MED FILL — VIT D2 1.25 MG (50,000 UNIT: 1.25 MG | 84 days supply | Qty: 12 | Fill #0

## 2019-07-23 ENCOUNTER — Other Ambulatory Visit: Payer: Self-pay

## 2019-07-23 ENCOUNTER — Ambulatory Visit (INDEPENDENT_AMBULATORY_CARE_PROVIDER_SITE_OTHER): Payer: No Typology Code available for payment source | Admitting: Physical Therapy

## 2019-07-23 ENCOUNTER — Encounter: Payer: Self-pay | Admitting: Physical Therapy

## 2019-07-23 DIAGNOSIS — M25562 Pain in left knee: Secondary | ICD-10-CM | POA: Diagnosis not present

## 2019-07-23 DIAGNOSIS — G8929 Other chronic pain: Secondary | ICD-10-CM | POA: Diagnosis not present

## 2019-07-23 DIAGNOSIS — M6281 Muscle weakness (generalized): Secondary | ICD-10-CM

## 2019-07-23 NOTE — Therapy (Addendum)
[facility] [address] [place], [place], [address] Phone: [phone]   Fax:  [phone]  Physical Therapy Evaluation PHYSICAL THERAPY DISCHARGE SUMMARY  Visits from Start of Care: 1  Current functional level related to goals / functional outcomes: Unable to assess due to unplanned discharge  Remaining deficits: Unable to assess due to unplanned discharge  Education / Equipment: Unable to assess due to unplanned discharge  Patient agrees to discharge. Patient goals were not met. Patient is being discharged due to not returning since the last visit.  Signing therapist has never treated or seen patient, but is discharging patient on behalf of last treating therapist secondary to long standing open episode that needs closing.  2:54 PM, 06/11/21 Jerene Pitch, DPT Physical Therapy with [facility]  [phone] office     Patient Details  Name: Brandi Estrada MRN: [id number] Date of Birth: April 19, 1986 Referring Provider (PT): Garald Balding, MD   Encounter Date: 07/23/2019  PT End of Session - 07/23/19 2246     Visit Number  1    Number of Visits  8    Date for PT Re-Evaluation  09/03/19    PT Start Time  5465    PT Stop Time  1630    PT Time Calculation (min)  52 min    Activity Tolerance  Patient tolerated treatment well    Behavior During Therapy  [facility] for tasks assessed/performed        Past Medical History:  Diagnosis Date   Chicken pox    Heartburn    Jaundice    as baby    Past Surgical History:  Procedure Laterality Date   WISDOM TOOTH EXTRACTION      There were no vitals filed for this visit.   Subjective Assessment - 07/23/19 1535     Subjective  She does work as a Chartered certified accountant at [employer] is on her feet all day long but notes most of her trouble occurs when she has prolonged periods of knee in flexion.  She is has occasional feeling of her knee giving out but popping  and clicking.  She recently returned from a ski vacation and noted that she was having a lot of trouble towards the end of the day.    Pertinent History  no significant PMH    Limitations  Lifting;Standing;Walking;House hold activities    How long can you walk comfortably?  mile    Diagnostic tests  XR: very small lateral femoral condyle osteophyte.  Lateral patella tilt    Patient Stated Goals  work    Currently in Pain?  Yes    Pain Score  6     Pain Location  Knee    Pain Orientation  Left;Anterior    Pain Descriptors / Indicators  Aching;Sharp    Pain Type  Chronic pain    Pain Radiating Towards  denies          [facility] PT Assessment - 07/23/19 0001       Assessment   Medical Diagnosis  Chronic Left knee pain, chondromalacia patella    Referring Provider (PT)  Garald Balding, MD    Onset Date/Surgical Date  --   chronic knee pain for at least 2 years   Next MD Visit  nothing scheduled    Prior Therapy  none      Precautions   Precautions  None      Restrictions   Weight Bearing Restrictions  No      Balance Screen   Has the patient fallen in the past 6 months  No      [place] residence      Prior Function   Level of Independence  Independent    Vocation  Full time employment    Doctor, hospital   Overall Cognitive Status  Within Functional Limits for tasks assessed      Sensation   Light Touch  Appears Intact      ROM / Strength   AROM / PROM / Strength  AROM;Strength      AROM   Overall AROM Comments  bilat knee ROM WNL      Strength   Overall Strength Comments  knee strength 5/5, but Lt hip strength overall 4/5 MMT       Flexibility   Soft Tissue Assessment /Muscle Length  --   tight quads, IT band on Lt     Palpation   Patella mobility  mild Lt knee lateral patella tracking and crepitus, lateral patella tilt    Palpation comment  denies TTP      Special Tests   Other  special tests  negative ligamentous testing, negative mcmurrays test, able to maintain SLS for 30 seconds, can squat below 90 deg but decreased weight shift over to her left upon comming back up      Transfers   Transfers  Independent with all Transfers      Ambulation/Gait   Gait Comments  WNL pattern and velocity                 Objective measurements completed on examination: See above findings.      New Site Adult PT Treatment/Exercise - 07/23/19 0001       Manual Therapy   Manual therapy comments  KT tape for lateral patella tracking               PT Education - 07/23/19 2246     Education Details  HEP, POC, KT tape    Person(s) Educated  Patient    Methods  Explanation;Demonstration;Verbal cues;Handout    Comprehension  Verbalized understanding;Need further instruction           PT Long Term Goals - 07/23/19 2251       PT LONG TERM GOAL #1   Title  Pt will be I and compliant with HEP. (target for all goals 6 weeks 09/03/19)    Status  New      PT LONG TERM GOAL #2   Title  Pt will improve Lt hip strength to 5/5 to improve function.    Status  New      PT LONG TERM GOAL #3   Title  Pt will be able to perform usual activities including, work, driving, stairs, and squat with less than 2-3/10 pain    Status  New              Plan - 07/23/19 2247     Clinical Impression Statement  Pt presents with chronic Lt knee pain, chondromalacia patella with mild lateral tracking and lateral patella tilt. She has overall good knee strength and ROM but decreased hip strength, tightness in her IT bands and quads, and increased pain with stairs, squatting, driving, and prolonged walking. She will benefit from skilled PT to address her deficits.    Examination-Activity Limitations  Lift;Stand;Locomotion Level;Squat;Stairs  Examination-Participation Restrictions  Community Activity;Driving;Shop    Stability/Clinical Decision Making  Stable/Uncomplicated     Clinical Decision Making  Low    Rehab Potential  Good    PT Frequency  2x / week   1-2   PT Treatment/Interventions  ADLs/Self Care Home Management;Cryotherapy;Electrical Stimulation;Iontophoresis 76m/ml Dexamethasone;Moist Heat;Ultrasound;Therapeutic activities;Therapeutic exercise;Balance training;Neuromuscular re-education;Manual techniques;Dry needling;Taping;Joint Manipulations    PT Next Visit Plan  how was kT tape? review and update HEP PRN, needs hip strength, SL stability, lateral patella correction    PT Home Exercise Plan  Access Code: [email or web address]    Consulted and Agree with Plan of Care  Patient        Patient will benefit from skilled therapeutic intervention in order to improve the following deficits and impairments:  Decreased activity tolerance, Decreased strength, Difficulty walking, Pain  Visit Diagnosis: Chronic pain of left knee  Muscle weakness (generalized)     Problem List Patient Active Problem List   Diagnosis Date Noted   Chondromalacia patellae, left knee 06/28/2019   Upper airway cough syndrome 07/23/2016    Brandi Mesi2/22/2021, 10:53 PM  [facility] [address] [address]Phone: [phone]  Fax:  [phone] Name: JKERON KOFFMANMRN: [id number] of Birth: 1December 16, 1987

## 2019-07-23 NOTE — Patient Instructions (Signed)
Access Code: [email or web address]  URL: [email or web address]  Date: 07/23/2019  Prepared by: Ivery Quale   Exercises  Supine Quadriceps Stretch with Strap on Table - 2-3 reps - 30 hold - 2x daily - 6x weekly  Supine ITB Stretch with Strap - 2-3 reps - 30 hold - 2x daily - 6x weekly  Supine Hip Adduction Isometric with Ball - 10 reps - 1-2 sets - 5 sec hold - 2x daily - 6x weekly  Straight Leg Raise with External Rotation - 10 reps - 3 sets - 2x daily - 6x weekly  Sidelying Hip Abduction - 10 reps - 1-3 sets - 2x daily - 6x weekly  Sidelying Hip Adduction - 10 reps - 3 sets - 2x daily - 6x weekly  Prone Hip Extension - 10 reps - 3 sets - 2x daily - 6x weekly  Seated Long Arc Quad with Hip Adduction - 10 reps - 3 sets - 2x daily - 6x weekly  Sit to Stand with Ball Between Knees - 10 reps - 2-3 sets - 2x daily - 6x weekly

## 2019-08-08 ENCOUNTER — Encounter: Payer: No Typology Code available for payment source | Admitting: Physical Therapy

## 2019-08-16 ENCOUNTER — Encounter: Payer: No Typology Code available for payment source | Admitting: Physical Therapy

## 2019-08-16 ENCOUNTER — Telehealth: Payer: Self-pay | Admitting: Physical Therapy

## 2019-08-16 NOTE — Telephone Encounter (Signed)
Pt did not show for PT appointment today. They were contacted and informed of this via voicemail. They were provided the date and time of their next appointment on voicemail. They were instructed to call us to let us know if they cannot make their appointment.  Ivery Quale, PT, DPT 08/16/19 12:16 PM

## 2019-08-22 ENCOUNTER — Encounter: Payer: Self-pay | Admitting: Family Medicine

## 2019-08-23 ENCOUNTER — Encounter: Payer: No Typology Code available for payment source | Admitting: Physical Therapy

## 2019-08-27 ENCOUNTER — Encounter: Payer: No Typology Code available for payment source | Admitting: Physical Therapy

## 2020-01-20 ENCOUNTER — Encounter: Payer: Self-pay | Admitting: Family Medicine

## 2022-05-13 ENCOUNTER — Encounter: Payer: Self-pay | Admitting: *Deleted
